# Patient Record
Sex: Male | Born: 1965 | Race: White | Hispanic: No | Marital: Married | Smoking: Current every day smoker
Health system: Southern US, Community
[De-identification: ages and names within clinical notes are randomized; demographics above are authoritative.]

## PROBLEM LIST (undated history)

## (undated) DIAGNOSIS — M5136 Other intervertebral disc degeneration, lumbar region: Secondary | ICD-10-CM

## (undated) DIAGNOSIS — E785 Hyperlipidemia, unspecified: Secondary | ICD-10-CM

## (undated) DIAGNOSIS — I10 Essential (primary) hypertension: Secondary | ICD-10-CM

## (undated) DIAGNOSIS — M51369 Other intervertebral disc degeneration, lumbar region without mention of lumbar back pain or lower extremity pain: Secondary | ICD-10-CM

## (undated) DIAGNOSIS — E669 Obesity, unspecified: Secondary | ICD-10-CM

## (undated) DIAGNOSIS — M5030 Other cervical disc degeneration, unspecified cervical region: Secondary | ICD-10-CM

## (undated) DIAGNOSIS — Z8249 Family history of ischemic heart disease and other diseases of the circulatory system: Secondary | ICD-10-CM

## (undated) HISTORY — DX: Hyperlipidemia, unspecified: E78.5

## (undated) HISTORY — DX: Essential (primary) hypertension: I10

## (undated) HISTORY — DX: Other intervertebral disc degeneration, lumbar region: M51.36

## (undated) HISTORY — DX: Obesity, unspecified: E66.9

## (undated) HISTORY — DX: Other cervical disc degeneration, unspecified cervical region: M50.30

## (undated) HISTORY — DX: Other intervertebral disc degeneration, lumbar region without mention of lumbar back pain or lower extremity pain: M51.369

## (undated) HISTORY — DX: Family history of ischemic heart disease and other diseases of the circulatory system: Z82.49

---

## 2012-06-19 ENCOUNTER — Other Ambulatory Visit: Payer: Self-pay | Admitting: Physician Assistant

## 2012-06-19 NOTE — Telephone Encounter (Signed)
Medication refilled per protocol. 

## 2012-10-04 ENCOUNTER — Other Ambulatory Visit: Payer: Managed Care, Other (non HMO)

## 2012-10-04 DIAGNOSIS — I10 Essential (primary) hypertension: Secondary | ICD-10-CM

## 2012-10-04 DIAGNOSIS — Z79899 Other long term (current) drug therapy: Secondary | ICD-10-CM

## 2012-10-04 DIAGNOSIS — E785 Hyperlipidemia, unspecified: Secondary | ICD-10-CM

## 2012-10-04 LAB — LIPID PANEL
HDL: 37 mg/dL — ABNORMAL LOW (ref >39)
LDL Cholesterol: 78 mg/dL (ref 0–99)
Total CHOL/HDL Ratio: 4.3 Ratio
VLDL: 45 mg/dL — ABNORMAL HIGH (ref 0–40)

## 2012-10-04 LAB — CBC WITH DIFFERENTIAL/PLATELET
Basophils Absolute: 0.1 10*3/uL (ref 0.0–0.1)
Eosinophils Relative: 4 % (ref 0–5)
Lymphocytes Relative: 31 % (ref 12–46)
MCV: 87.7 fL (ref 78.0–100.0)
Neutro Abs: 5.5 10*3/uL (ref 1.7–7.7)
Neutrophils Relative %: 56 % (ref 43–77)
Platelets: 322 10*3/uL (ref 150–400)
RBC: 4.79 MIL/uL (ref 4.22–5.81)
RDW: 12.6 % (ref 11.5–15.5)

## 2012-10-04 LAB — CMP AND LIVER
ALT: 16 U/L (ref 0–53)
AST: 15 U/L (ref 0–37)
Alkaline Phosphatase: 90 U/L (ref 39–117)
CO2: 29 mEq/L (ref 19–32)
Creat: 0.85 mg/dL (ref 0.50–1.35)
Sodium: 138 mEq/L (ref 135–145)
Total Bilirubin: 0.5 mg/dL (ref 0.3–1.2)
Total Protein: 6.5 g/dL (ref 6.0–8.3)

## 2012-10-09 ENCOUNTER — Encounter: Payer: Self-pay | Admitting: Physician Assistant

## 2012-10-09 ENCOUNTER — Ambulatory Visit (INDEPENDENT_AMBULATORY_CARE_PROVIDER_SITE_OTHER): Payer: Managed Care, Other (non HMO) | Admitting: Physician Assistant

## 2012-10-09 VITALS — BP 132/88 | HR 76 | Temp 98.3°F | Resp 18 | Ht 69.5 in | Wt 317.0 lb

## 2012-10-09 DIAGNOSIS — Z8249 Family history of ischemic heart disease and other diseases of the circulatory system: Secondary | ICD-10-CM

## 2012-10-09 DIAGNOSIS — F17200 Nicotine dependence, unspecified, uncomplicated: Secondary | ICD-10-CM

## 2012-10-09 DIAGNOSIS — I10 Essential (primary) hypertension: Secondary | ICD-10-CM | POA: Insufficient documentation

## 2012-10-09 DIAGNOSIS — E785 Hyperlipidemia, unspecified: Secondary | ICD-10-CM | POA: Insufficient documentation

## 2012-10-09 DIAGNOSIS — E669 Obesity, unspecified: Secondary | ICD-10-CM | POA: Insufficient documentation

## 2012-10-09 HISTORY — DX: Family history of ischemic heart disease and other diseases of the circulatory system: Z82.49

## 2012-10-09 MED ORDER — ATORVASTATIN CALCIUM 80 MG PO TABS: 80.0000 mg | ORAL_TABLET | Freq: Every day | ORAL | Status: DC

## 2012-10-09 MED ORDER — LISINOPRIL-HYDROCHLOROTHIAZIDE 20-25 MG PO TABS: ORAL_TABLET | ORAL | Status: DC

## 2012-10-09 NOTE — Progress Notes (Signed)
Patient ID: Jorge Bowman MRN: [id number], DOB: Nov 11, 1965, 47 y.o. Date of Encounter: @DATE @  Chief Complaint:  Chief Complaint  Patient presents with  . routine check up    med refills   had labs last week    HPI: 47 y.o. year old obese white male  presents for routine followup office visit.  Hypertension: He is taking medication as directed. No adverse effects. No lightheadedness. No leukemic edema. He checks his blood pressure at home and it's good readings.  Hyperlipidemia: He is taking Lipitor 80 mg as directed. He has no myalgias and no other adverse effects.  He reports that he knows that he and his weight is up today compared to the last office visit. 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 He is a very sedentary lifestyle with no exercise he says he has cut out sodas but otherwise has a very difficult time consuming a good diet  As well he continues to smoke and is smoking about one pack per day.  When he does any physical exertion he reports that he has no chest pressure tightness or heaviness he has no increased shortness of breath or dyspnea on exertion .   Past Medical History  Diagnosis Date  . Hyperlipidemia   . Hypertension   . Obesity   . Family history of premature coronary artery disease 10/09/2012     Home Meds: See attached medication section for current medication list. Any medications entered into computer today will not appear on this note's list. The medications listed below were entered prior to today. No current outpatient prescriptions on file prior to visit.   No current facility-administered medications on file prior to visit.    Allergies: No Known Allergies  History   Social History  . Marital Status: Married    Spouse Name: N/A    Number of Children: N/A  . Years of Education: N/A   Occupational History  . Not on file.   Social History Main Topics  . Smoking status: Current Every Day Smoker -- 1.00 packs/day   Types: Cigarettes  . Smokeless tobacco: Never Used  . Alcohol Use: No  . Drug Use: No  . Sexual Activity: Not on file   Other Topics Concern  . 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    Family History  Problem Relation Age of Onset  . Heart disease Father 50    stent     Review of Systems:  See HPI for pertinent ROS. All other ROS negative.    Physical Exam: Blood pressure 132/88, pulse 76, temperature 98.3 F (36.8 C), temperature source Oral, resp. rate 18, height 5' 9.5" (1.765 m), weight 317 lb (143.79 kg)., Body mass index is 46.16 kg/(m^2). General: An obese white male .Appears in no acute distress. Neck: Supple. No thyromegaly. No lymphadenopathy. No carotid bruit. Lungs: Clear bilaterally to auscultation without wheezes, rales, or rhonchi. Breathing is unlabored. Heart: RRR with S1 S2. No murmurs, rubs, or gallops. Abdomen: Soft, non-tender, non-distended with normoactive bowel sounds. No hepatomegaly. No rebound/guarding. No obvious abdominal masses. Musculoskeletal:  Strength and tone normal for age. Extremities/Skin: Warm and dry.  No edema.  Neuro: Alert and oriented X 3. Moves all extremities spontaneously. Gait is normal. CNII-XII grossly in tact. Psych:  Responds to questions appropriately with a normal affect.   Results for orders placed in  visit on 10/04/12  LIPID PANEL      Result Value Range   Cholesterol 160  0 - 200 mg/dL   Triglycerides 811 (*) <150 mg/dL   HDL 37 (*) >91 mg/dL   Total CHOL/HDL Ratio 4.3     VLDL 45 (*) 0 - 40 mg/dL   LDL Cholesterol 78  0 - 99 mg/dL  CMP AND LIVER      Result Value Range   Sodium 138  135 - 145 mEq/L   Potassium 4.6  3.5 - 5.3 mEq/L   Chloride 100  96 - 112 mEq/L   CO2 29  19 - 32 mEq/L   Glucose, Bld 99  70 - 99 mg/dL   BUN 14  6 - 23 mg/dL   Creat 4.78  2.95 - 6.21 mg/dL   Total Bilirubin 0.5  0.3 - 1.2 mg/dL   Alkaline Phosphatase 90   39 - 117 U/L   AST 15  0 - 37 U/L   ALT 16  0 - 53 U/L   Total Protein 6.5  6.0 - 8.3 g/dL   Albumin 4.2  3.5 - 5.2 g/dL   Calcium 9.1  8.4 - 30.8 mg/dL   Bilirubin, Direct 0.1  0.0 - 0.3 mg/dL   Indirect Bilirubin 0.4  0.0 - 0.9 mg/dL  CBC WITH DIFFERENTIAL      Result Value Range   WBC 9.8  4.0 - 10.5 K/uL   RBC 4.79  4.22 - 5.81 MIL/uL   Hemoglobin 14.7  13.0 - 17.0 g/dL   HCT 65.7  84.6 - 96.2 %   MCV 87.7  78.0 - 100.0 fL   MCH 30.7  26.0 - 34.0 pg   MCHC 35.0  30.0 - 36.0 g/dL   RDW 95.2  84.1 - 32.4 %   Platelets 322  150 - 400 K/uL   Neutrophils Relative % 56  43 - 77 %   Neutro Abs 5.5  1.7 - 7.7 K/uL   Lymphocytes Relative 31  12 - 46 %   Lymphs Abs 3.1  0.7 - 4.0 K/uL   Monocytes Relative 8  3 - 12 %   Monocytes Absolute 0.8  0.1 - 1.0 K/uL   Eosinophils Relative 4  0 - 5 %   Eosinophils Absolute 0.4  0.0 - 0.7 K/uL   Basophils Relative 1  0 - 1 %   Basophils Absolute 0.1  0.0 - 0.1 K/uL   Smear Review Criteria for review not met       ASSESSMENT AND PLAN:  47 y.o. year old male with  1. Family history of premature coronary artery disease Father with history of CAD. Father had stents and first diagnosed CAD in his 56s.  2. Hyperlipidemia At goal. Continue current medication. Recheck fasting labs and liver function test in 6 months. - atorvastatin (LIPITOR) 80 MG tablet; Take 1 tablet (80 mg total) by mouth at bedtime.  Dispense: 30 tablet; Refill: 5  3. Hypertension At goal. As well he says that he gets even better blood pressure readings at home. Antinuclear medication. Followup office visit and lab in 6 months. - lisinopril-hydrochlorothiazide (PRINZIDE,ZESTORETIC) 20-25 MG per tablet; TAKE ONE TABLET BY MOUTH EVERY DAY  Dispense: 30 tablet; Refill: 5  4. Obesity He really needs weight loss and compliance with diet and exercise especially in light of his family history. 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  5. Smoker unmotivated to quit He is not interested in using Chantix. I discussed possibly using Wellbutrin which could possibly help curb his cravings for her nicotine and smoking as well as curb his appetite for food. He says he will consider this but is not interested in pursuing this today. Discussed with him trying to at least decrease the amount of cigarettes he is smoking per day even if he cannot quit.  Have discussed with him that especially given his father's family history he really needs to do everything he can to decrease his risk for developing cardiac disease. He really needs to improve his diet and exercise and weight loss and off it he really needs to stop smoking to help reduce this risk. Fasting labs a regular office visit in 6 months or sooner if he needs [place].   [address] [place], [place], [facility] 10/09/2012 5:41 PM

## 2013-05-21 ENCOUNTER — Other Ambulatory Visit: Payer: Managed Care, Other (non HMO)

## 2013-05-21 DIAGNOSIS — Z79899 Other long term (current) drug therapy: Secondary | ICD-10-CM

## 2013-05-21 DIAGNOSIS — E785 Hyperlipidemia, unspecified: Secondary | ICD-10-CM

## 2013-05-21 LAB — LIPID PANEL
CHOL/HDL RATIO: 3.9 ratio
CHOLESTEROL: 139 mg/dL (ref 0–200)
HDL: 36 mg/dL — ABNORMAL LOW (ref >39)
LDL CALC: 74 mg/dL (ref 0–99)
TRIGLYCERIDES: 146 mg/dL (ref <150)
VLDL: 29 mg/dL (ref 0–40)

## 2013-05-21 LAB — CBC WITH DIFFERENTIAL/PLATELET
BASOS PCT: 1 % (ref 0–1)
Basophils Absolute: 0.1 10*3/uL (ref 0.0–0.1)
EOS ABS: 0.5 10*3/uL (ref 0.0–0.7)
Eosinophils Relative: 5 % (ref 0–5)
HCT: 42.6 % (ref 39.0–52.0)
HEMOGLOBIN: 14.6 g/dL (ref 13.0–17.0)
Lymphocytes Relative: 37 % (ref 12–46)
Lymphs Abs: 4 10*3/uL (ref 0.7–4.0)
MCH: 30.1 pg (ref 26.0–34.0)
MCHC: 34.3 g/dL (ref 30.0–36.0)
MCV: 87.8 fL (ref 78.0–100.0)
MONOS PCT: 9 % (ref 3–12)
Monocytes Absolute: 1 10*3/uL (ref 0.1–1.0)
NEUTROS ABS: 5.1 10*3/uL (ref 1.7–7.7)
NEUTROS PCT: 48 % (ref 43–77)
PLATELETS: 328 10*3/uL (ref 150–400)
RBC: 4.85 MIL/uL (ref 4.22–5.81)
RDW: 12.8 % (ref 11.5–15.5)
WBC: 10.7 10*3/uL — ABNORMAL HIGH (ref 4.0–10.5)

## 2013-05-21 LAB — COMPLETE METABOLIC PANEL WITH GFR
ALBUMIN: 4 g/dL (ref 3.5–5.2)
ALK PHOS: 90 U/L (ref 39–117)
ALT: 14 U/L (ref 0–53)
AST: 14 U/L (ref 0–37)
BILIRUBIN TOTAL: 0.4 mg/dL (ref 0.2–1.2)
BUN: 16 mg/dL (ref 6–23)
CO2: 32 mEq/L (ref 19–32)
Calcium: 9.2 mg/dL (ref 8.4–10.5)
Chloride: 102 mEq/L (ref 96–112)
Creat: 0.92 mg/dL (ref 0.50–1.35)
GFR, Est African American: >89 mL/min
GFR, Est Non African American: >89 mL/min
Glucose, Bld: 92 mg/dL (ref 70–99)
POTASSIUM: 4.7 meq/L (ref 3.5–5.3)
SODIUM: 140 meq/L (ref 135–145)
TOTAL PROTEIN: 6.2 g/dL (ref 6.0–8.3)

## 2013-06-04 ENCOUNTER — Ambulatory Visit (INDEPENDENT_AMBULATORY_CARE_PROVIDER_SITE_OTHER): Payer: Managed Care, Other (non HMO) | Admitting: Physician Assistant

## 2013-06-04 ENCOUNTER — Encounter: Payer: Self-pay | Admitting: Physician Assistant

## 2013-06-04 VITALS — BP 120/60 | HR 68 | Temp 98.7°F | Resp 18 | Ht 70.0 in | Wt 303.0 lb

## 2013-06-04 DIAGNOSIS — Z8249 Family history of ischemic heart disease and other diseases of the circulatory system: Secondary | ICD-10-CM

## 2013-06-04 DIAGNOSIS — E785 Hyperlipidemia, unspecified: Secondary | ICD-10-CM

## 2013-06-04 DIAGNOSIS — E669 Obesity, unspecified: Secondary | ICD-10-CM

## 2013-06-04 DIAGNOSIS — I10 Essential (primary) hypertension: Secondary | ICD-10-CM

## 2013-06-04 MED ORDER — ATORVASTATIN CALCIUM 80 MG PO TABS: 80.0000 mg | ORAL_TABLET | Freq: Every day | ORAL | Status: DC

## 2013-06-04 MED ORDER — LISINOPRIL-HYDROCHLOROTHIAZIDE 20-25 MG PO TABS: ORAL_TABLET | ORAL | Status: DC

## 2013-06-04 NOTE — Progress Notes (Signed)
Patient ID: Kamren Heintzelman MRN: [id number], DOB: 1965-10-08, 48 y.o. Date of Encounter: @DATE @  Chief Complaint:  Chief Complaint  Patient presents with  . medication check    declined Tdap and PNA shot    HPI: 48 y.o. year old obese white male  presents for routine followup office visit.  Hypertension: He is taking medication as directed. No adverse effects. No lightheadedness. No lower extremity edema. He checks his blood pressure at home and gets good readings.  Hyperlipidemia: He is taking Lipitor 80 mg as directed. He has no myalgias and no other adverse effects.  At his LOV, which was 10/09/12 --He reported that he knew that  his weight was up  compared to the prior office visit. 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 He reported a very sedentary lifestyle with no exercise. Also,  he said he had cut out sodas but otherwise had a very difficult time consuming a good diet. TODAY: He says that he has been exercising every day for at least 30 minutes sometimes up to 90 minutes. Says he does  " Power Walking".  Says that he sometimes walks outside. Says he also has a treadmill at home that he can use if needed. Says that when he is traveling and is in a hotel-- he turns the heat to 80 and walks back and forth across the hotel room!!  Says he likes to keep up with the news so  he turns on the news on TV and  walks back and forth while he listens to that--says it keep shim entertained!!  Says he is smoking about the same amount as at the last visit. Says this is about  1 - 1 1/2 ppd.  When he does any physical exertion he reports that he has no chest pressure tightness or heaviness he has no increased shortness of breath or dyspnea on exertion .   Past Medical History  Diagnosis Date  . Hyperlipidemia   . Hypertension   . Obesity   . Family history of premature coronary artery disease 10/09/2012     Home Meds: See attached medication section for current medication  list. Any medications entered into computer today will not appear on this note's list. The medications listed below were entered prior to today. He is on Lipitor 80 mg daily. He is on lisinopril HCTZ 20/25 mg.  Current Outpatient Prescriptions on File Prior to Visit  Medication Sig Dispense Refill  . Multiple Vitamin (MULTIVITAMIN) tablet Take 1 tablet by mouth daily.       No current facility-administered medications on file prior to visit.    Allergies: No Known Allergies  History   Social History  . Marital Status: Married    Spouse Name: N/A    Number of Children: N/A  . Years of Education: N/A   Occupational History  . Not on file.   Social History Main Topics  . Smoking status: Current Every Day Smoker -- 1.00 packs/day    Types: Cigarettes  . Smokeless tobacco: Never Used  . Alcohol Use: No  . Drug Use: No  . Sexual Activity: Not on file   Other Topics Concern  . 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    Family History  Problem Relation Age of Onset  . Heart disease Father 13    stent  Review of Systems:  See HPI for pertinent ROS. All other ROS negative.    Physical Exam: Blood pressure 120/60, pulse 68, temperature 98.7 F (37.1 C), temperature source Oral, resp. rate 18, height 5' 10"  (1.778 m), weight 303 lb (137.44 kg)., Body mass index is 43.48 kg/(m^2). General: An obese white male .Appears in no acute distress. Neck: Supple. No thyromegaly. No lymphadenopathy. No carotid bruit. Lungs: Clear bilaterally to auscultation without wheezes, rales, or rhonchi. Breathing is unlabored. Heart: RRR with S1 S2. No murmurs, rubs, or gallops. Abdomen: Soft, non-tender, non-distended with normoactive bowel sounds. No hepatomegaly. No rebound/guarding. No obvious abdominal masses. Musculoskeletal:  Strength and tone normal for age. Extremities/Skin: Warm and dry.  No edema.  Neuro: Alert  and oriented X 3. Moves all extremities spontaneously. Gait is normal. CNII-XII grossly in tact. Psych:  Responds to questions appropriately with a normal affect.   Results for orders placed in visit on 05/21/13  LIPID PANEL      Result Value Ref Range   Cholesterol 139  0 - 200 mg/dL   Triglycerides 146  <150 mg/dL   HDL 36 (*) >39 mg/dL   Total CHOL/HDL Ratio 3.9     VLDL 29  0 - 40 mg/dL   LDL Cholesterol 74  0 - 99 mg/dL  CBC WITH DIFFERENTIAL      Result Value Ref Range   WBC 10.7 (*) 4.0 - 10.5 K/uL   RBC 4.85  4.22 - 5.81 MIL/uL   Hemoglobin 14.6  13.0 - 17.0 g/dL   HCT 42.6  39.0 - 52.0 %   MCV 87.8  78.0 - 100.0 fL   MCH 30.1  26.0 - 34.0 pg   MCHC 34.3  30.0 - 36.0 g/dL   RDW 12.8  11.5 - 15.5 %   Platelets 328  150 - 400 K/uL   Neutrophils Relative % 48  43 - 77 %   Neutro Abs 5.1  1.7 - 7.7 K/uL   Lymphocytes Relative 37  12 - 46 %   Lymphs Abs 4.0  0.7 - 4.0 K/uL   Monocytes Relative 9  3 - 12 %   Monocytes Absolute 1.0  0.1 - 1.0 K/uL   Eosinophils Relative 5  0 - 5 %   Eosinophils Absolute 0.5  0.0 - 0.7 K/uL   Basophils Relative 1  0 - 1 %   Basophils Absolute 0.1  0.0 - 0.1 K/uL   Smear Review Criteria for review not met    COMPLETE METABOLIC PANEL WITH GFR      Result Value Ref Range   Sodium 140  135 - 145 mEq/L   Potassium 4.7  3.5 - 5.3 mEq/L   Chloride 102  96 - 112 mEq/L   CO2 32  19 - 32 mEq/L   Glucose, Bld 92  70 - 99 mg/dL   BUN 16  6 - 23 mg/dL   Creat 0.92  0.50 - 1.35 mg/dL   Total Bilirubin 0.4  0.2 - 1.2 mg/dL   Alkaline Phosphatase 90  39 - 117 U/L   AST 14  0 - 37 U/L   ALT 14  0 - 53 U/L   Total Protein 6.2  6.0 - 8.3 g/dL   Albumin 4.0  3.5 - 5.2 g/dL   Calcium 9.2  8.4 - 10.5 mg/dL   GFR, Est African American >89     GFR, Est Non African American >89       ASSESSMENT AND  PLAN:  48 y.o. year old male with  1. Family history of premature coronary artery disease Father with history of CAD. Father had stents and first  diagnosed CAD in his 22s.  2. Hyperlipidemia At goal. Continue current medication. Recheck fasting labs and liver function test in 6 months. IF HE CONTINUES WEIGHT LOSS, CAN PROBABLY DECREASE LIPITOR DOSE IN FUTURE - atorvastatin (LIPITOR) 80 MG tablet; Take 1 tablet (80 mg total) by mouth at bedtime.  Dispense: 30 tablet; Refill: 5  3. Hypertension At goal. Continue current medication. Followup office visit and lab in 6 months. - lisinopril-hydrochlorothiazide (PRINZIDE,ZESTORETIC) 20-25 MG per tablet; TAKE ONE TABLET BY MOUTH EVERY DAY  Dispense: 30 tablet; Refill: 5  4. Obesity CONGRATS!!--Good Job with the weight loss and exercise!! At office visit 10/2012 weight was 317.  Today is 303. !!  5. Smoker unmotivated to quit He is not interested in using Chantix. I discussed possibly using Wellbutrin which could possibly help curb his cravings for her nicotine and smoking as well as curb his appetite for food. He says he will consider this but is not interested in pursuing this today. Discussed with him trying to at least decrease the amount of cigarettes he is smoking per day even if he cannot quit. We Had the same conversation at last office visit and had the same conversation again today. He has not decreased his smoking at all.  Have discussed with him that especially given his father's family history he really needs to do everything he can to decrease his risk for developing cardiac disease. He really needs to improve his diet and exercise and weight loss and off it he really needs to stop smoking to help reduce this risk. Good job on the exercise and weight loss!!  ToDay discussed that he needs to have a pneumonia vaccine given his smoking. He defers. Discussed that he would only need this 1 pneumonia vaccine and he would not need another one until age 74. He says that he will research this and think about it and let me know at  the next visit.  Also discussed tetanus vaccine. Says he has  not had one in greater than 10 years. He defers this today.  Fasting labs and regular office visit in 6 months or sooner if he needs [place]. He states that he prefers to have his labs done while he is here at the visit instead of coming here for 2 separate trips. To schedule his next appointment early morning and come fasting to the appointment so we can do labs while he is here at the visit.   Signed, [address] [place], [place], [facility] 06/04/2013 9:50 AM

## 2013-06-18 ENCOUNTER — Encounter: Payer: Self-pay | Admitting: Physician Assistant

## 2013-06-18 ENCOUNTER — Ambulatory Visit (INDEPENDENT_AMBULATORY_CARE_PROVIDER_SITE_OTHER): Payer: Managed Care, Other (non HMO) | Admitting: Physician Assistant

## 2013-06-18 VITALS — BP 122/86 | HR 76 | Temp 98.3°F | Resp 18 | Wt 300.0 lb

## 2013-06-18 DIAGNOSIS — R1900 Intra-abdominal and pelvic swelling, mass and lump, unspecified site: Secondary | ICD-10-CM

## 2013-06-18 NOTE — Progress Notes (Signed)
Patient ID: Jorge Bowman MRN: [id number], DOB: 1965-11-10, 48 y.o. Date of Encounter: 06/18/2013, 3:50 PM    Chief Complaint:  Chief Complaint  Patient presents with  . c/o poss Hiatal Hernia    knot pops up under rib cage when active     HPI: 48 y.o. year old white male reports that for about 2 years now he has occasionally noticed a sharp pain in his left upper quadrant, just below the left rib cage. 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    Home Meds: See attached medication section for any medications that were entered at today's visit  The computer does not put those onto this list.The following list is a list of meds entered prior to today's visit.   Current Outpatient Prescriptions on File Prior to Visit  Medication Sig Dispense Refill  . atorvastatin (LIPITOR) 80 MG tablet Take 1 tablet (80 mg total) by mouth at bedtime.  30 tablet  5  . lisinopril-hydrochlorothiazide (PRINZIDE,ZESTORETIC) 20-25 MG per tablet TAKE ONE TABLET BY MOUTH EVERY DAY  30 tablet  5  . Multiple Vitamin (MULTIVITAMIN) tablet Take 1 tablet by mouth daily.       No current facility-administered medications on file prior to visit.      Allergies: No Known Allergies    Review of Systems: See HPI for pertinent ROS. All other ROS negative.    Physical Exam: Blood pressure 122/86, pulse 76, temperature 98.3 F (36.8 C), temperature source Oral, resp. rate 18, weight 300 lb (136.079 kg)., Body mass index is 43.05 kg/(m^2). General:  Obese WM. Appears in no acute  distress. Neck: Supple. No thyromegaly. No lymphadenopathy. Lungs: Clear bilaterally to auscultation without wheezes, rales, or rhonchi. Breathing is unlabored. Heart: Regular rhythm. No murmurs, rubs, or gallops. Abdomen: Soft, non-tender, non-distended with normoactive bowel sounds. No hepatomegaly. No rebound/guarding.  With inspection, there is an area on the left upper quadrant, just below the left rib cage, that looks like it protrudes slightly more than the surrounding area and more than the corresponding area on the right side of the body. However, this is VERY minimal/slight. With palpation, I can feel no definite edge to this "mass"/area. There is no palpable area of firmness.  I also had him slowly roll down from sitting to lying--inspecting and palpating area with this movement but no change in exam with this. Also inspected area as he slowly went from lying to sitting--again-no change in exam findings with this either.  Msk:  Strength and tone normal for age. Extremities/Skin: Warm and dry. Neuro: Alert and oriented X 3. Moves all extremities spontaneously. Gait is normal. CNII-XII grossly in tact. Psych:  Responds to questions appropriately with a normal affect.     ASSESSMENT AND PLAN:  48 y.o. year old male with  1. Mass of abdomen He recently had labs including a CBC and CMET which were normal. At this time will obtain a CT to further evaluate. Will Followup with  patient when I receive CT results. - CT Abdomen Pelvis W Contrast; Future   Signed, [address] [place], [place], [facility] 06/18/2013 3:50 PM

## 2013-06-19 ENCOUNTER — Ambulatory Visit (HOSPITAL_COMMUNITY): Payer: Managed Care, Other (non HMO)

## 2013-06-22 ENCOUNTER — Ambulatory Visit (HOSPITAL_COMMUNITY)
Admission: RE | Admit: 2013-06-22 | Discharge: 2013-06-22 | Disposition: A | Discharge status: Home / Self Care | Payer: Managed Care, Other (non HMO) | Source: Ambulatory Visit | Attending: Physician Assistant | Admitting: Physician Assistant

## 2013-06-22 ENCOUNTER — Telehealth: Payer: Self-pay | Admitting: Family Medicine

## 2013-06-22 DIAGNOSIS — E279 Disorder of adrenal gland, unspecified: Principal | ICD-10-CM

## 2013-06-22 DIAGNOSIS — R1012 Left upper quadrant pain: Secondary | ICD-10-CM | POA: Insufficient documentation

## 2013-06-22 DIAGNOSIS — R1900 Intra-abdominal and pelvic swelling, mass and lump, unspecified site: Secondary | ICD-10-CM

## 2013-06-22 DIAGNOSIS — R1902 Left upper quadrant abdominal swelling, mass and lump: Secondary | ICD-10-CM | POA: Insufficient documentation

## 2013-06-22 DIAGNOSIS — E278 Other specified disorders of adrenal gland: Secondary | ICD-10-CM

## 2013-06-22 MED ORDER — IOHEXOL 300 MG/ML  SOLN
100.0000 mL | Freq: Once | INTRAMUSCULAR | Status: AC | PRN
  Administered 2013-06-22: 100 mL via INTRAVENOUS

## 2013-06-22 NOTE — Telephone Encounter (Signed)
Message copied by Olena Mater on Fri Jun 22, 2013  4:24 PM ------      Message from: Dena Billet      Created: Fri Jun 22, 2013  3:30 PM       Tell pt that the CT shows nothing to explain the area of concern (he had noticed area just under left rib cage as appearing to protrude more than that same area on the right) and also was having episodes of pain in that area).      However, tell him CT findings of "indeterminate left adrenal nodule" and "indeterminate lesion left kidney".      Tell him the recommendations of: 1--Pre and Post Contrast MRI--to f/u lesion in left kidney.                                                              2- F/U CT in 12 months to f/u Adrenal Nodule.       Please Place these orders. ------

## 2013-06-22 NOTE — Telephone Encounter (Signed)
Pt aware of CT result and further recommendations.  MRI ordered.

## 2013-07-06 ENCOUNTER — Ambulatory Visit (HOSPITAL_COMMUNITY)
Admission: RE | Admit: 2013-07-06 | Discharge: 2013-07-06 | Disposition: A | Discharge status: Home / Self Care | Payer: Managed Care, Other (non HMO) | Source: Ambulatory Visit | Attending: Physician Assistant | Admitting: Physician Assistant

## 2013-07-06 DIAGNOSIS — E279 Disorder of adrenal gland, unspecified: Principal | ICD-10-CM

## 2013-07-06 DIAGNOSIS — E278 Other specified disorders of adrenal gland: Secondary | ICD-10-CM

## 2013-07-08 NOTE — Progress Notes (Signed)
This man recently had an office visit with me. He then had an abdominal CT. The Radiologist who read the CT recommended followup MRI. It appears that this MRI was supposed to have been done on Friday 07/06/13. However, it appears that the patient did not show for this procedure. Please followup with the patient to figure out why he did not go. Find out whether he simply forgot or whether he needs it rescheduled to a different time or whether he does not plan to go for this followup and then let me know.

## 2013-07-16 ENCOUNTER — Encounter (HOSPITAL_COMMUNITY): Payer: Self-pay

## 2013-07-16 ENCOUNTER — Ambulatory Visit (HOSPITAL_COMMUNITY)
Admission: RE | Admit: 2013-07-16 | Discharge: 2013-07-16 | Disposition: A | Discharge status: Home / Self Care | Payer: Managed Care, Other (non HMO) | Source: Ambulatory Visit | Attending: Physician Assistant | Admitting: Physician Assistant

## 2013-07-16 ENCOUNTER — Other Ambulatory Visit: Payer: Self-pay | Admitting: Physician Assistant

## 2013-07-16 DIAGNOSIS — E278 Other specified disorders of adrenal gland: Secondary | ICD-10-CM

## 2013-07-16 DIAGNOSIS — E279 Disorder of adrenal gland, unspecified: Secondary | ICD-10-CM

## 2013-07-16 DIAGNOSIS — D3500 Benign neoplasm of unspecified adrenal gland: Secondary | ICD-10-CM | POA: Insufficient documentation

## 2013-07-16 LAB — POCT I-STAT CREATININE: Creatinine, Ser: 1 mg/dL (ref 0.50–1.35)

## 2013-07-16 MED ORDER — SODIUM CHLORIDE 0.9 % IV SOLN
INTRAVENOUS | Status: AC
  Filled 2013-07-16: qty 200

## 2013-07-16 MED ORDER — GADOBENATE DIMEGLUMINE 529 MG/ML IV SOLN: 20.0000 mL | Freq: Once | INTRAVENOUS | Status: AC | PRN

## 2013-12-31 ENCOUNTER — Other Ambulatory Visit: Payer: Managed Care, Other (non HMO)

## 2013-12-31 DIAGNOSIS — Z8249 Family history of ischemic heart disease and other diseases of the circulatory system: Secondary | ICD-10-CM

## 2013-12-31 DIAGNOSIS — I10 Essential (primary) hypertension: Secondary | ICD-10-CM

## 2013-12-31 DIAGNOSIS — E785 Hyperlipidemia, unspecified: Secondary | ICD-10-CM

## 2013-12-31 LAB — CBC WITH DIFFERENTIAL/PLATELET
Basophils Absolute: 0.1 10*3/uL (ref 0.0–0.1)
Basophils Relative: 1 % (ref 0–1)
Eosinophils Absolute: 0.5 10*3/uL (ref 0.0–0.7)
Eosinophils Relative: 5 % (ref 0–5)
HEMATOCRIT: 42.3 % (ref 39.0–52.0)
Hemoglobin: 15 g/dL (ref 13.0–17.0)
LYMPHS PCT: 33 % (ref 12–46)
Lymphs Abs: 3.1 10*3/uL (ref 0.7–4.0)
MCH: 30.7 pg (ref 26.0–34.0)
MCHC: 35.5 g/dL (ref 30.0–36.0)
MCV: 86.5 fL (ref 78.0–100.0)
MONO ABS: 0.9 10*3/uL (ref 0.1–1.0)
MPV: 10.1 fL (ref 9.4–12.4)
Monocytes Relative: 10 % (ref 3–12)
NEUTROS ABS: 4.8 10*3/uL (ref 1.7–7.7)
Neutrophils Relative %: 51 % (ref 43–77)
Platelets: 339 10*3/uL (ref 150–400)
RBC: 4.89 MIL/uL (ref 4.22–5.81)
RDW: 12.8 % (ref 11.5–15.5)
WBC: 9.4 10*3/uL (ref 4.0–10.5)

## 2013-12-31 LAB — COMPLETE METABOLIC PANEL WITH GFR
ALBUMIN: 4.1 g/dL (ref 3.5–5.2)
ALK PHOS: 109 U/L (ref 39–117)
ALT: 19 U/L (ref 0–53)
AST: 16 U/L (ref 0–37)
BUN: 14 mg/dL (ref 6–23)
CO2: 29 mEq/L (ref 19–32)
Calcium: 9 mg/dL (ref 8.4–10.5)
Chloride: 100 mEq/L (ref 96–112)
Creat: 0.83 mg/dL (ref 0.50–1.35)
GFR, Est African American: >89 mL/min
GFR, Est Non African American: >89 mL/min
GLUCOSE: 95 mg/dL (ref 70–99)
POTASSIUM: 5 meq/L (ref 3.5–5.3)
Sodium: 138 mEq/L (ref 135–145)
TOTAL PROTEIN: 6.6 g/dL (ref 6.0–8.3)
Total Bilirubin: 0.4 mg/dL (ref 0.2–1.2)

## 2013-12-31 LAB — LIPID PANEL
Cholesterol: 159 mg/dL (ref 0–200)
HDL: 33 mg/dL — AB (ref >39)
LDL CALC: 94 mg/dL (ref 0–99)
Total CHOL/HDL Ratio: 4.8 Ratio
Triglycerides: 159 mg/dL — ABNORMAL HIGH (ref <150)
VLDL: 32 mg/dL (ref 0–40)

## 2014-01-07 ENCOUNTER — Ambulatory Visit (INDEPENDENT_AMBULATORY_CARE_PROVIDER_SITE_OTHER): Payer: Managed Care, Other (non HMO) | Admitting: Physician Assistant

## 2014-01-07 ENCOUNTER — Encounter: Payer: Self-pay | Admitting: Physician Assistant

## 2014-01-07 VITALS — BP 114/68 | HR 76 | Temp 98.5°F | Resp 18 | Wt 308.0 lb

## 2014-01-07 DIAGNOSIS — E669 Obesity, unspecified: Secondary | ICD-10-CM

## 2014-01-07 DIAGNOSIS — Z8249 Family history of ischemic heart disease and other diseases of the circulatory system: Secondary | ICD-10-CM

## 2014-01-07 DIAGNOSIS — H6501 Acute serous otitis media, right ear: Secondary | ICD-10-CM

## 2014-01-07 DIAGNOSIS — E785 Hyperlipidemia, unspecified: Secondary | ICD-10-CM

## 2014-01-07 DIAGNOSIS — I10 Essential (primary) hypertension: Secondary | ICD-10-CM

## 2014-01-07 MED ORDER — AMOXICILLIN-POT CLAVULANATE 875-125 MG PO TABS: 1.0000 | ORAL_TABLET | Freq: Two times a day (BID) | ORAL | Status: DC

## 2014-01-07 MED ORDER — LISINOPRIL-HYDROCHLOROTHIAZIDE 20-25 MG PO TABS: ORAL_TABLET | ORAL | Status: DC

## 2014-01-07 MED ORDER — ATORVASTATIN CALCIUM 80 MG PO TABS: 80.0000 mg | ORAL_TABLET | Freq: Every day | ORAL | Status: DC

## 2014-01-07 NOTE — Progress Notes (Signed)
Patient ID: Jorge Bowman MRN: [id number], DOB: 05-09-1965, 48 y.o. Date of Encounter: @DATE @  Chief Complaint:  Chief Complaint  Patient presents with  . routine check up    labs last week  . Medication Refill    HPI: 48 y.o. year old obese white male  presents for routine followup office visit.  Hypertension: He is taking medication as directed. No adverse effects. No lightheadedness. No lower extremity edema. He checks his blood pressure at home and gets good readings.  Hyperlipidemia: He is taking Lipitor 80 mg as directed. He has no myalgias and no other adverse effects.  At his OV  10/09/12 --He reported that he knew that  his weight was up  compared to the prior office visit. 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 He reported a very sedentary lifestyle with no exercise. Also,  he said he had cut out sodas but otherwise had a very difficult time consuming a good diet.  At [place] 06/2013: He says that he has been exercising every day for at least 30 minutes sometimes up to 90 minutes. Says he does  " Power Walking".  Says that he sometimes walks outside. Says he also has a treadmill at home that he can use if needed. Says that when he is traveling and is in a hotel-- he turns the heat to 80 and walks back and forth across the hotel room!!  Says he likes to keep up with the news so  he turns on the news on TV and  walks back and forth while he listens to that--says it keep shim entertained!!   At [place] 01/2014--he says that he is no longer traveling--is home at nights now---but is using his treadmill about 3 days / week.    At [place] 01/2014--he says that recently in mornings has felt pressure in ears. Several days ago felt sore lymph node right neck.  Blowing no mucus from nose. No increased cough (smoker)  Says he is smoking about the same amount as at the last visit. Says this is about  1 - 1 1/2 ppd.  When he does any physical exertion he reports that he has no chest  pressure tightness or heaviness he has no increased shortness of breath or dyspnea on exertion .   Past Medical History  Diagnosis Date  . Hyperlipidemia   . Hypertension   . Obesity   . Family history of premature coronary artery disease 10/09/2012     Home Meds: Outpatient Prescriptions Prior to Visit  Medication Sig Dispense Refill  . atorvastatin (LIPITOR) 80 MG tablet Take 1 tablet (80 mg total) by mouth at bedtime. 30 tablet 5  . lisinopril-hydrochlorothiazide (PRINZIDE,ZESTORETIC) 20-25 MG per tablet TAKE ONE TABLET BY MOUTH EVERY DAY 30 tablet 5  . Multiple Vitamin (MULTIVITAMIN) tablet Take 1 tablet by mouth daily.     No facility-administered medications prior to visit.     Current Outpatient Prescriptions on File Prior to Visit  Medication Sig Dispense Refill  . atorvastatin (LIPITOR) 80 MG tablet Take 1 tablet (80 mg total) by mouth at bedtime. 30 tablet 5  . lisinopril-hydrochlorothiazide (PRINZIDE,ZESTORETIC) 20-25 MG per tablet TAKE ONE TABLET BY MOUTH EVERY DAY 30 tablet 5  . Multiple Vitamin (MULTIVITAMIN) tablet Take 1 tablet by mouth daily.     No current facility-administered medications on file prior to visit.    Allergies: No Known Allergies  History   Social History  . Marital Status:  Legally Separated    Spouse Name: N/A    Number of Children: N/A  . Years of Education: N/A   Occupational History  . Not on file.   Social History Main Topics  . Smoking status: Current Every Day Smoker -- 1.00 packs/day    Types: Cigarettes  . Smokeless tobacco: Never Used  . Alcohol Use: No  . Drug Use: No  . Sexual Activity: Not on file   Other Topics Concern  . 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    Family History  Problem Relation Age of Onset  . Heart disease Father 1    stent     Review of Systems:  See HPI for pertinent ROS. All other ROS negative.    Physical  Exam: Blood pressure 114/68, pulse 76, temperature 98.5 F (36.9 C), temperature source Oral, resp. rate 18, weight 308 lb (139.708 kg)., Body mass index is 44.19 kg/(m^2). General: An obese white male .Appears in no acute distress. Left TM: slightly dull. O/w nml Right TM; There is area of bright red erythema around periphery.      There is area of golden color. Dull  Throat--naormal.  Sinuses: No tenderness wht percussion.  Neck: Supple. No thyromegaly. No lymphadenopathy. No carotid bruit. Lungs: Clear bilaterally to auscultation without wheezes, rales, or rhonchi. Breathing is unlabored. Heart: RRR with S1 S2. No murmurs, rubs, or gallops. Abdomen: Soft, non-tender, non-distended with normoactive bowel sounds. No hepatomegaly. No rebound/guarding. No obvious abdominal masses. Musculoskeletal:  Strength and tone normal for age. Extremities/Skin: Warm and dry.  No edema.  Neuro: Alert and oriented X 3. Moves all extremities spontaneously. Gait is normal. CNII-XII grossly in tact. Psych:  Responds to questions appropriately with a normal affect.   Results for orders placed or performed in visit on 12/31/13  Lipid Panel  Result Value Ref Range   Cholesterol 159 0 - 200 mg/dL   Triglycerides 159 (H) <150 mg/dL   HDL 33 (L) >39 mg/dL   Total CHOL/HDL Ratio 4.8 Ratio   VLDL 32 0 - 40 mg/dL   LDL Cholesterol 94 0 - 99 mg/dL  CBC with Differential  Result Value Ref Range   WBC 9.4 4.0 - 10.5 K/uL   RBC 4.89 4.22 - 5.81 MIL/uL   Hemoglobin 15.0 13.0 - 17.0 g/dL   HCT 42.3 39.0 - 52.0 %   MCV 86.5 78.0 - 100.0 fL   MCH 30.7 26.0 - 34.0 pg   MCHC 35.5 30.0 - 36.0 g/dL   RDW 12.8 11.5 - 15.5 %   Platelets 339 150 - 400 K/uL   Neutrophils Relative % 51 43 - 77 %   Neutro Abs 4.8 1.7 - 7.7 K/uL   Lymphocytes Relative 33 12 - 46 %   Lymphs Abs 3.1 0.7 - 4.0 K/uL   Monocytes Relative 10 3 - 12 %   Monocytes Absolute 0.9 0.1 - 1.0 K/uL   Eosinophils Relative 5 0 - 5 %   Eosinophils  Absolute 0.5 0.0 - 0.7 K/uL   Basophils Relative 1 0 - 1 %   Basophils Absolute 0.1 0.0 - 0.1 K/uL   Smear Review Criteria for review not met    MPV 10.1 9.4 - 12.4 fL  COMPLETE METABOLIC PANEL WITH GFR  Result Value Ref Range   Sodium 138 135 - 145 mEq/L   Potassium 5.0 3.5 - 5.3 mEq/L  Chloride 100 96 - 112 mEq/L   CO2 29 19 - 32 mEq/L   Glucose, Bld 95 70 - 99 mg/dL   BUN 14 6 - 23 mg/dL   Creat 0.83 0.50 - 1.35 mg/dL   Total Bilirubin 0.4 0.2 - 1.2 mg/dL   Alkaline Phosphatase 109 39 - 117 U/L   AST 16 0 - 37 U/L   ALT 19 0 - 53 U/L   Total Protein 6.6 6.0 - 8.3 g/dL   Albumin 4.1 3.5 - 5.2 g/dL   Calcium 9.0 8.4 - 10.5 mg/dL   GFR, Est African American >89 mL/min   GFR, Est Non African American >89 mL/min     ASSESSMENT AND PLAN:  48 y.o. year old male with  1. Family history of premature coronary artery disease Father with history of CAD. Father had stents and first diagnosed CAD in his 78s.  2. Hyperlipidemia At goal. Continue current medication. Recheck fasting labs and liver function test in 6 months. - atorvastatin (LIPITOR) 80 MG tablet; Take 1 tablet (80 mg total) by mouth at bedtime.  Dispense: 30 tablet; Refill: 5  3. Hypertension At goal. Continue current medication. Followup office visit and lab in 6 months. - lisinopril-hydrochlorothiazide (PRINZIDE,ZESTORETIC) 20-25 MG per tablet; TAKE ONE TABLET BY MOUTH EVERY DAY  Dispense: 30 tablet; Refill: 5  4. Obesity CONGRATS!!--Good Job with the weight loss and exercise!! At office visit 10/2012 weight was 317.  06/2013 is 303. !! 01/2014--305---says had vacation in October, then Thanksgiving--try to keep this controlled!!!  5. Smoker unmotivated to quit He is not interested in using Chantix. I discussed possibly using Wellbutrin which could possibly help curb his cravings for her nicotine and smoking as well as curb his appetite for food. He says he will consider this but is not interested in pursuing this today.  Discussed with him trying to at least decrease the amount of cigarettes he is smoking per day even if he cannot quit. We Had the same conversation at last office visit and had the same conversation again today. He has not decreased his smoking at all.  Have discussed with him that especially given his father's family history he really needs to do everything he can to decrease his risk for developing cardiac disease. He really needs to improve his diet and exercise and weight loss and off it he really needs to stop smoking to help reduce this risk. Good job on the exercise and weight loss!!   discussed that he needs to have a pneumonia vaccine given his smoking. He defers. Discussed that he would only need this 1 pneumonia vaccine and he would not need another one until age 13. He says that he will research this and think about it and let me know at  the next visit.  Also discussed tetanus vaccine. Says he has not had one in greater than 10 years. He defers this today.  Discussed vaccines at length 06/2013 and again 01/2014---still defers.   Fasting labs and regular office visit in 6 months or sooner if he needs [place]. He states that he prefers to have his labs done while he is here at the visit instead of coming here for 2 separate trips. To schedule his next appointment early morning and come fasting to the appointment so we can do labs while he is here at the visit.  5. Right acute serous otitis media, recurrence not specified - amoxicillin-clavulanate (AUGMENTIN) 875-125 MG per tablet; Take 1 tablet by mouth 2 (  two) times daily.  Dispense: 20 tablet; Refill: 0 Take all of abx. F/y if sx do not resolve after completion of abx. Also use otc decongestant.   HE WILL TURN 49 08/18/2014---AFTER THAT WILL HAVE TO DISCUSS PSA AND COLONOSCOPY   Signed, [facility], [place], [facility] 01/07/2014 10:34 AM

## 2014-05-27 ENCOUNTER — Ambulatory Visit (INDEPENDENT_AMBULATORY_CARE_PROVIDER_SITE_OTHER): Payer: Managed Care, Other (non HMO) | Admitting: Family Medicine

## 2014-05-27 ENCOUNTER — Encounter: Payer: Self-pay | Admitting: Family Medicine

## 2014-05-27 VITALS — BP 110/74 | HR 80 | Temp 98.5°F | Resp 18 | Ht 71.0 in | Wt 304.0 lb

## 2014-05-27 DIAGNOSIS — N50812 Left testicular pain: Secondary | ICD-10-CM

## 2014-05-27 DIAGNOSIS — N508 Other specified disorders of male genital organs: Secondary | ICD-10-CM | POA: Diagnosis not present

## 2014-05-27 DIAGNOSIS — N451 Epididymitis: Secondary | ICD-10-CM | POA: Diagnosis not present

## 2014-05-27 MED ORDER — DOXYCYCLINE HYCLATE 100 MG PO TABS: 100.0000 mg | ORAL_TABLET | Freq: Two times a day (BID) | ORAL | Status: DC

## 2014-05-27 NOTE — Progress Notes (Signed)
   Subjective:    Patient ID: Jorge Bowman, male    DOB: [birth year], 49 y.o.   MRN: [id number]  HPI Patient has had one month of pain in his left testicle. The pain began after he experienced a severe stomach virus and was retching and vomiting frequently. He is concerned he may have an inguinal hernia. On examination today there is no inguinal hernia. There is no ventral hernia in the suprapubic area. However the patient is exquisitely tender to palpation on the superior testicle and in the distal spermatic cord. He describes the pain is though someone just thumped him on the testicle. It is worse with walking and activity. He denies any dysuria. He denies any hematuria. He denies any penile discharge. He denies any exposure to gonorrhea or chlamydia. Past Medical History  Diagnosis Date  . Hyperlipidemia   . Hypertension   . Obesity   . Family history of premature coronary artery disease 10/09/2012   No past surgical history on file. Current Outpatient Prescriptions on File Prior to Visit  Medication Sig Dispense Refill  . atorvastatin (LIPITOR) 80 MG tablet Take 1 tablet (80 mg total) by mouth at bedtime. 30 tablet 5  . lisinopril-hydrochlorothiazide (PRINZIDE,ZESTORETIC) 20-25 MG per tablet TAKE ONE TABLET BY MOUTH EVERY DAY 30 tablet 5  . Multiple Vitamin (MULTIVITAMIN) tablet Take 1 tablet by mouth daily.     No current facility-administered medications on file prior to visit.   No Known Allergies History   Social History  . Marital Status: Legally Separated    Spouse Name: N/A  . Number of Children: N/A  . Years of Education: N/A   Occupational History  . Not on file.   Social History Main Topics  . Smoking status: Current Every Day Smoker -- 1.00 packs/day    Types: Cigarettes  . Smokeless tobacco: Never Used  . Alcohol Use: No  . Drug Use: No  . Sexual Activity: Not on file   Other Topics Concern  . 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      Review of Systems  All other systems reviewed and are negative.      Objective:   Physical Exam  Cardiovascular: Normal rate and regular rhythm.   Pulmonary/Chest: Effort normal and breath sounds normal.  Abdominal: Hernia confirmed negative in the right inguinal area and confirmed negative in the left inguinal area.  Genitourinary: Right testis shows no mass, no swelling and no tenderness. Left testis shows tenderness. Left testis shows no mass.  Lymphadenopathy:       Right: No inguinal adenopathy present.       Left: No inguinal adenopathy present.  Vitals reviewed.  patient is very tender over the epididymis and distal spermatic cord        Assessment & Plan:  Epididymitis - Plan: doxycycline (VIBRA-TABS) 100 MG tablet  Testicular pain, left  Differential diagnosis includes chronic testicular pain due to irritation versus epididymitis. I recommended doxycycline 100 mg by mouth twice a day for 10 days. Also recommended he switch his undergarments from boxers to more supportive white briefs.  Recheck in 2 weeks if no better or sooner if worse. Consider scrotal ultrasound if persistent.

## 2014-06-19 ENCOUNTER — Telehealth: Payer: Self-pay | Admitting: Family Medicine

## 2014-06-19 DIAGNOSIS — E279 Disorder of adrenal gland, unspecified: Principal | ICD-10-CM

## 2014-06-19 DIAGNOSIS — N451 Epididymitis: Secondary | ICD-10-CM

## 2014-06-19 DIAGNOSIS — E278 Other specified disorders of adrenal gland: Secondary | ICD-10-CM

## 2014-06-19 MED ORDER — DOXYCYCLINE HYCLATE 100 MG PO TABS: 100.0000 mg | ORAL_TABLET | Freq: Two times a day (BID) | ORAL | Status: DC

## 2014-06-19 NOTE — Telephone Encounter (Signed)
Pt aware and med sent to pharm 

## 2014-06-19 NOTE — Telephone Encounter (Signed)
Pt completed the Doxycycline and was fine - his pain resolved - but now the pain has returned - he states that Dr. Dennard Schaumann told him if it come back he would do a second round of Doxy and if it was no better after that he would either get an ultrasound or send him to see urology. Pt is wanting to do another round of Doxy if possible. MD please advise.

## 2014-06-19 NOTE — Telephone Encounter (Signed)
Okay to refill, if not improved needs Urology referral

## 2014-06-19 NOTE — Telephone Encounter (Signed)
I reviewed CT report from 06/22/13. They did recommend follow-up scan in 12 months. Approved.

## 2014-06-19 NOTE — Telephone Encounter (Signed)
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  1 year follow up recommended.  Pt ready for [place] to schedule.  Order placed.

## 2014-06-24 ENCOUNTER — Other Ambulatory Visit: Payer: Self-pay | Admitting: Physician Assistant

## 2014-06-24 DIAGNOSIS — E279 Disorder of adrenal gland, unspecified: Principal | ICD-10-CM

## 2014-06-24 DIAGNOSIS — E278 Other specified disorders of adrenal gland: Secondary | ICD-10-CM

## 2014-06-28 ENCOUNTER — Ambulatory Visit (HOSPITAL_COMMUNITY): Payer: Managed Care, Other (non HMO)

## 2014-06-28 ENCOUNTER — Ambulatory Visit (HOSPITAL_COMMUNITY)
Admission: RE | Admit: 2014-06-28 | Discharge: 2014-06-28 | Disposition: A | Discharge status: Home / Self Care | Payer: Managed Care, Other (non HMO) | Source: Ambulatory Visit | Attending: Physician Assistant | Admitting: Physician Assistant

## 2014-06-28 DIAGNOSIS — I10 Essential (primary) hypertension: Secondary | ICD-10-CM | POA: Insufficient documentation

## 2014-06-28 DIAGNOSIS — E279 Disorder of adrenal gland, unspecified: Secondary | ICD-10-CM | POA: Diagnosis present

## 2014-06-28 DIAGNOSIS — E278 Other specified disorders of adrenal gland: Secondary | ICD-10-CM

## 2014-06-28 MED ORDER — IOHEXOL 300 MG/ML  SOLN
100.0000 mL | Freq: Once | INTRAMUSCULAR | Status: AC | PRN
  Administered 2014-06-28: 100 mL via INTRAVENOUS

## 2014-07-11 ENCOUNTER — Ambulatory Visit (INDEPENDENT_AMBULATORY_CARE_PROVIDER_SITE_OTHER): Payer: Managed Care, Other (non HMO) | Admitting: Physician Assistant

## 2014-07-11 ENCOUNTER — Encounter: Payer: Self-pay | Admitting: Physician Assistant

## 2014-07-11 VITALS — BP 110/60 | HR 74 | Temp 98.5°F | Resp 19 | Wt 285.0 lb

## 2014-07-11 DIAGNOSIS — Z720 Tobacco use: Secondary | ICD-10-CM | POA: Diagnosis not present

## 2014-07-11 DIAGNOSIS — F17200 Nicotine dependence, unspecified, uncomplicated: Secondary | ICD-10-CM | POA: Insufficient documentation

## 2014-07-11 DIAGNOSIS — E785 Hyperlipidemia, unspecified: Secondary | ICD-10-CM

## 2014-07-11 DIAGNOSIS — R1900 Intra-abdominal and pelvic swelling, mass and lump, unspecified site: Secondary | ICD-10-CM

## 2014-07-11 DIAGNOSIS — Z8249 Family history of ischemic heart disease and other diseases of the circulatory system: Secondary | ICD-10-CM | POA: Diagnosis not present

## 2014-07-11 DIAGNOSIS — I10 Essential (primary) hypertension: Secondary | ICD-10-CM

## 2014-07-11 DIAGNOSIS — E669 Obesity, unspecified: Secondary | ICD-10-CM | POA: Diagnosis not present

## 2014-07-11 LAB — COMPLETE METABOLIC PANEL WITH GFR
ALBUMIN: 4.2 g/dL (ref 3.5–5.2)
ALK PHOS: 99 U/L (ref 39–117)
ALT: 13 U/L (ref 0–53)
AST: 16 U/L (ref 0–37)
BILIRUBIN TOTAL: 0.5 mg/dL (ref 0.2–1.2)
BUN: 15 mg/dL (ref 6–23)
CO2: 28 mEq/L (ref 19–32)
Calcium: 9.5 mg/dL (ref 8.4–10.5)
Chloride: 97 mEq/L (ref 96–112)
Creat: 0.97 mg/dL (ref 0.50–1.35)
GFR, Est African American: >89 mL/min
GFR, Est Non African American: >89 mL/min
Glucose, Bld: 84 mg/dL (ref 70–99)
POTASSIUM: 4.9 meq/L (ref 3.5–5.3)
SODIUM: 134 meq/L — AB (ref 135–145)
TOTAL PROTEIN: 6.9 g/dL (ref 6.0–8.3)

## 2014-07-11 LAB — LIPID PANEL
Cholesterol: 132 mg/dL (ref 0–200)
HDL: 32 mg/dL — ABNORMAL LOW (ref >=40)
LDL CALC: 76 mg/dL (ref 0–99)
TRIGLYCERIDES: 122 mg/dL (ref <150)
Total CHOL/HDL Ratio: 4.1 Ratio
VLDL: 24 mg/dL (ref 0–40)

## 2014-07-11 NOTE — Progress Notes (Signed)
Patient ID: Jorge Bowman MRN: [id number], DOB: 1965-07-30, 49 y.o. Date of Encounter: @DATE @  Chief Complaint:  Chief Complaint  Patient presents with  . med. Check    HPI: 49 y.o. year old obese white male  presents for routine followup office visit.  Hypertension: He is taking medication as directed. No adverse effects. No lightheadedness. No lower extremity edema. He checks his blood pressure at home and gets good readings.  Hyperlipidemia: He is taking Lipitor 80 mg as directed. He has no myalgias and no other adverse effects.  At his OV  10/09/12 --He reported that he knew that  his weight was up  compared to the prior office visit. 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 He reported a very sedentary lifestyle with no exercise. Also,  he said he had cut out sodas but otherwise had a very difficult time consuming a good diet.  At [place] 06/2013: He says that he has been exercising every day for at least 30 minutes sometimes up to 90 minutes. Says he does  " Power Walking".  Says that he sometimes walks outside. Says he also has a treadmill at home that he can use if needed. Says that when he is traveling and is in a hotel-- he turns the heat to 80 and walks back and forth across the hotel room!!  Says he likes to keep up with the news so  he turns on the news on TV and  walks back and forth while he listens to that--says it keep shim entertained!!   At [place] 01/2014--he says that he is no longer traveling--is home at nights now---but is using his treadmill about 3 days / week.    Says he is smoking about the same amount as at the last visit. Says this is about  1 - 1 1/2 ppd.  When he does any physical exertion he reports that he has no chest pressure tightness or heaviness he has no increased shortness of breath or dyspnea on exertion .   Past Medical History  Diagnosis Date  . Hyperlipidemia   . Hypertension   . Obesity   . Family history of premature coronary  artery disease 10/09/2012     Home Meds: Outpatient Prescriptions Prior to Visit  Medication Sig Dispense Refill  . atorvastatin (LIPITOR) 80 MG tablet Take 1 tablet (80 mg total) by mouth at bedtime. 30 tablet 5  . lisinopril-hydrochlorothiazide (PRINZIDE,ZESTORETIC) 20-25 MG per tablet TAKE ONE TABLET BY MOUTH EVERY DAY 30 tablet 5  . Multiple Vitamin (MULTIVITAMIN) tablet Take 1 tablet by mouth daily.    Marland Kitchen doxycycline (VIBRA-TABS) 100 MG tablet Take 1 tablet (100 mg total) by mouth 2 (two) times daily. (Patient not taking: Reported on 07/11/2014) 20 tablet 0   No facility-administered medications prior to visit.       Allergies: No Known Allergies  History   Social History  . Marital Status: Legally Separated    Spouse Name: N/A  . Number of Children: N/A  . Years of Education: N/A   Occupational History  . Not on file.   Social History Main Topics  . Smoking status: Current Every Day Smoker -- 1.00 packs/day    Types: Cigarettes  . Smokeless tobacco: Never Used  . Alcohol Use: No  . Drug Use: No  . Sexual Activity: Not on file   Other Topics Concern  . 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    Family History  Problem Relation Age of Onset  . Heart disease Father 58    stent     Review of Systems:  See HPI for pertinent ROS. All other ROS negative.    Physical Exam: Blood pressure 110/60, pulse 74, temperature 98.5 F (36.9 C), temperature source Oral, resp. rate 19, weight 285 lb (129.275 kg)., Body mass index is 39.77 kg/(m^2). General: An obese white male .Appears in no acute distress. Neck: Supple. No thyromegaly. No lymphadenopathy. No carotid bruit. Lungs: Clear bilaterally to auscultation without wheezes, rales, or rhonchi. Breathing is unlabored. Heart: RRR with S1 S2. No murmurs, rubs, or gallops. Abdomen: Soft, non-tender, non-distended with normoactive bowel sounds. No  hepatomegaly. No rebound/guarding. No obvious abdominal masses. Musculoskeletal:  Strength and tone normal for age. Extremities/Skin: Warm and dry.  No edema.  Neuro: Alert and oriented X 3. Moves all extremities spontaneously. Gait is normal. CNII-XII grossly in tact. Psych:  Responds to questions appropriately with a normal affect.      ASSESSMENT AND PLAN:  49 y.o. year old male with  1. Family history of premature coronary artery disease Father with history of CAD. Father had stents and first diagnosed CAD in his 84s.  2. Hyperlipidemia At goal last check. Recheck now.   - atorvastatin (LIPITOR) 80 MG tablet; Take 1 tablet (80 mg total) by mouth at bedtime.  Dispense: 30 tablet; Refill: 5  3. Hypertension At goal. Check lab to monitor now. Continue current medication. Followup office visit and lab in 6 months. - lisinopril-hydrochlorothiazide (PRINZIDE,ZESTORETIC) 20-25 MG per tablet; TAKE ONE TABLET BY MOUTH EVERY DAY  Dispense: 30 tablet; Refill: 5  4. Obesity CONGRATS!!--Good Job with the weight loss and exercise!! At office visit 10/2012 weight was 317.  06/2013 is 303. !! 01/2014--305---says had vacation in October, then Thanksgiving--try to keep this controlled!!! 07/2014---285---great!!  5. Smoker unmotivated to quit He is not interested in using Chantix. I discussed possibly using Wellbutrin which could possibly help curb his cravings for her nicotine and smoking as well as curb his appetite for food. He says he will consider this but is not interested in pursuing this today. Discussed with him trying to at least decrease the amount of cigarettes he is smoking per day even if he cannot quit. We Had the same conversation at last office visit and had the same conversation again today. He has not decreased his smoking at all.  Have discussed with him that especially given his father's family history he really needs to do everything he can to decrease his risk for developing  cardiac disease. He really needs to improve his diet and exercise and weight loss and off it he really needs to stop smoking to help reduce this risk. Good job on the exercise and weight loss!!   discussed that he needs to have a pneumonia vaccine given his smoking. He defers. Discussed that he would only need this 1 pneumonia vaccine and he would not need another one until age 24. He says that he will research this and think about it and let me know at  the next visit.  Also discussed tetanus vaccine. Says he has not had one in greater than 10 years. He defers this today.  Discussed vaccines at length 06/2013 and again 01/2014---still defers.   Fasting labs and regular office visit in 6 months or sooner if he needs [place]. He states that he  prefers to have his labs done while he is here at the visit instead of coming here for 2 separate trips. To schedule his next appointment early morning and come fasting to the appointment so we can do labs while he is here at the visit.   HE WILL TURN 49 08/18/2014---AFTER THAT WILL HAVE TO DISCUSS PSA AND COLONOSCOPY   Signed, Karis Juba, [place], [facility] 07/11/2014 8:51 AM

## 2014-07-12 ENCOUNTER — Encounter: Payer: Self-pay | Admitting: *Deleted

## 2014-07-28 ENCOUNTER — Other Ambulatory Visit: Payer: Self-pay | Admitting: Physician Assistant

## 2014-07-29 ENCOUNTER — Encounter: Payer: Self-pay | Admitting: Physician Assistant

## 2014-07-29 DIAGNOSIS — I10 Essential (primary) hypertension: Secondary | ICD-10-CM

## 2014-07-29 DIAGNOSIS — E785 Hyperlipidemia, unspecified: Secondary | ICD-10-CM

## 2014-07-29 MED ORDER — LISINOPRIL-HYDROCHLOROTHIAZIDE 20-25 MG PO TABS: ORAL_TABLET | ORAL | Status: DC

## 2014-07-29 MED ORDER — ATORVASTATIN CALCIUM 80 MG PO TABS: 80.0000 mg | ORAL_TABLET | Freq: Every day | ORAL | Status: DC

## 2015-01-16 ENCOUNTER — Encounter: Payer: Self-pay | Admitting: Physician Assistant

## 2015-01-16 ENCOUNTER — Ambulatory Visit (INDEPENDENT_AMBULATORY_CARE_PROVIDER_SITE_OTHER): Payer: Managed Care, Other (non HMO) | Admitting: Physician Assistant

## 2015-01-16 VITALS — BP 134/90 | HR 72 | Temp 98.4°F | Resp 18 | Wt 274.0 lb

## 2015-01-16 DIAGNOSIS — E669 Obesity, unspecified: Secondary | ICD-10-CM | POA: Diagnosis not present

## 2015-01-16 DIAGNOSIS — Z125 Encounter for screening for malignant neoplasm of prostate: Secondary | ICD-10-CM | POA: Insufficient documentation

## 2015-01-16 DIAGNOSIS — Z8249 Family history of ischemic heart disease and other diseases of the circulatory system: Secondary | ICD-10-CM | POA: Diagnosis not present

## 2015-01-16 DIAGNOSIS — B9689 Other specified bacterial agents as the cause of diseases classified elsewhere: Secondary | ICD-10-CM

## 2015-01-16 DIAGNOSIS — I10 Essential (primary) hypertension: Secondary | ICD-10-CM

## 2015-01-16 DIAGNOSIS — J988 Other specified respiratory disorders: Secondary | ICD-10-CM

## 2015-01-16 DIAGNOSIS — Z720 Tobacco use: Secondary | ICD-10-CM | POA: Diagnosis not present

## 2015-01-16 DIAGNOSIS — F17200 Nicotine dependence, unspecified, uncomplicated: Secondary | ICD-10-CM

## 2015-01-16 DIAGNOSIS — E785 Hyperlipidemia, unspecified: Secondary | ICD-10-CM

## 2015-01-16 LAB — COMPLETE METABOLIC PANEL WITH GFR
ALBUMIN: 4.1 g/dL (ref 3.6–5.1)
ALT: 12 U/L (ref 9–46)
AST: 14 U/L (ref 10–35)
Alkaline Phosphatase: 89 U/L (ref 40–115)
BILIRUBIN TOTAL: 0.4 mg/dL (ref 0.2–1.2)
BUN: 16 mg/dL (ref 7–25)
CO2: 28 mmol/L (ref 20–31)
Calcium: 9.3 mg/dL (ref 8.6–10.3)
Chloride: 101 mmol/L (ref 98–110)
Creat: 0.84 mg/dL (ref 0.70–1.33)
GFR, Est African American: >89 mL/min (ref >=60)
GFR, Est Non African American: >89 mL/min (ref >=60)
GLUCOSE: 93 mg/dL (ref 70–99)
Potassium: 4.3 mmol/L (ref 3.5–5.3)
SODIUM: 136 mmol/L (ref 135–146)
TOTAL PROTEIN: 6.7 g/dL (ref 6.1–8.1)

## 2015-01-16 LAB — LIPID PANEL
Cholesterol: 142 mg/dL (ref 125–200)
HDL: 33 mg/dL — ABNORMAL LOW (ref >=40)
LDL Cholesterol: 81 mg/dL (ref <130)
Total CHOL/HDL Ratio: 4.3 Ratio (ref <=5.0)
Triglycerides: 141 mg/dL (ref <150)
VLDL: 28 mg/dL (ref <30)

## 2015-01-16 MED ORDER — AZITHROMYCIN 250 MG PO TABS: ORAL_TABLET | ORAL | Status: DC

## 2015-01-16 NOTE — Progress Notes (Signed)
Patient ID: Jorge Bowman MRN: [id number], DOB: May 31, 1965, 49 y.o. Date of Encounter: @DATE @  Chief Complaint:  Chief Complaint  Patient presents with  . fasting labs/check up  . sick x 3 weeks    cough, congestion, ear ache    HPI: 49 y.o. year old obese white male  presents for routine followup office visit.  Says that he has been sick for 3 or 4 weeks now. Says he has head and chest congestion and also his right ear has been feeling clogged. Says that he has had problems with ear wax in the past he has been putting drops in his ear for that but really hasn't gotten relief of the discomfort he feels. Has had no fevers or chills and no significant sore throat.  Hypertension: He is taking medication as directed. No adverse effects. No lightheadedness. No lower extremity edema. He checks his blood pressure at home and gets good readings.  Hyperlipidemia: He is taking Lipitor 80 mg as directed. He has no myalgias and no other adverse effects.  At his OV  10/09/12 --He reported that he knew that  his weight was up  compared to the prior office visit. 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 He reported a very sedentary lifestyle with no exercise. Also,  he said he had cut out sodas but otherwise had a very difficult time consuming a good diet.  At [place] 06/2013: He says that he has been exercising every day for at least 30 minutes sometimes up to 90 minutes. Says he does  " Power Walking".  Says that he sometimes walks outside. Says he also has a treadmill at home that he can use if needed. Says that when he is traveling and is in a hotel-- he turns the heat to 80 and walks back and forth across the hotel room!!  Says he likes to keep up with the news so  he turns on the news on TV and  walks back and forth while he listens to that--says it keep shim entertained!!   At [place] 01/2014--he says that he is no longer traveling--is home at nights now---but is using his treadmill about 3  days / week.  At [place] 01/2015 he says that he is still at home at nights. Says that he uses his treadmill sometimes just 2 or 3 times per week but sometimes 5 times per week.  Has continued to lose weight. Weight today down to 274.   Says he is smoking about the same amount as at the last visit. Says this is about  1 - 1 1/2 ppd.  When he does any physical exertion he reports that he has no chest pressure tightness or heaviness he has no increased shortness of breath or dyspnea on exertion .   Past Medical History  Diagnosis Date  . Hyperlipidemia   . Hypertension   . Obesity   . Family history of premature coronary artery disease 10/09/2012     Home Meds: Outpatient Prescriptions Prior to Visit  Medication Sig Dispense Refill  . atorvastatin (LIPITOR) 80 MG tablet Take 1 tablet (80 mg total) by mouth at bedtime. 30 tablet 5  . lisinopril-hydrochlorothiazide (PRINZIDE,ZESTORETIC) 20-25 MG per tablet TAKE ONE TABLET BY MOUTH EVERY DAY 30 tablet 5  . Multiple Vitamin (MULTIVITAMIN) tablet Take 1 tablet by mouth daily.     No facility-administered medications prior to visit.       Allergies: No Known Allergies  Social History   Social History  . Marital Status: Legally Separated    Spouse Name: N/A  . Number of Children: N/A  . Years of Education: N/A   Occupational History  . Not on file.   Social History Main Topics  . Smoking status: Current Every Day Smoker -- 1.00 packs/day    Types: Cigarettes  . Smokeless tobacco: Never Used  . Alcohol Use: No  . Drug Use: No  . Sexual Activity: Not on file   Other Topics Concern  . 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    Family History  Problem Relation Age of Onset  . Heart disease Father 35    stent     Review of Systems:  See HPI for pertinent ROS. All other ROS negative.    Physical Exam: Blood pressure 134/90, pulse 72, temperature 98.4  F (36.9 C), temperature source Oral, resp. rate 18, weight 274 lb (124.286 kg)., Body mass index is 38.23 kg/(m^2). General: Obese white male .Appears in no acute distress. HEENT: Ears: Left ear canal and tympanic membrane normal. Right ear canal normal and patent. However the tympanic membrane does show fluid behind the TM. Also retracted. Posterior pharynx and tonsils appear normal with minimal erythema and no exudate and no peritonsillar abscess. Neck: Supple. No thyromegaly. No lymphadenopathy. No carotid bruit. Lungs: Clear bilaterally to auscultation without wheezes, rales, or rhonchi. Breathing is unlabored. Heart: RRR with S1 S2. No murmurs, rubs, or gallops. Abdomen: Soft, non-tender, non-distended with normoactive bowel sounds. No hepatomegaly. No rebound/guarding. No obvious abdominal masses. Musculoskeletal:  Strength and tone normal for age. Extremities/Skin: Warm and dry.  No edema.  Neuro: Alert and oriented X 3. Moves all extremities spontaneously. Gait is normal. CNII-XII grossly in tact. Psych:  Responds to questions appropriately with a normal affect.      ASSESSMENT AND PLAN:  49 y.o. year old male with  1. Family history of premature coronary artery disease Father with history of CAD. Father had stents and first diagnosed CAD in his 85s.  2. Hyperlipidemia At goal last check. Recheck now.   - atorvastatin (LIPITOR) 80 MG tablet; Take 1 tablet (80 mg total) by mouth at bedtime.  Dispense: 30 tablet; Refill: 5  3. Hypertension At goal. Check lab to monitor now. Continue current medication. Followup office visit and lab in 6 months. - lisinopril-hydrochlorothiazide (PRINZIDE,ZESTORETIC) 20-25 MG per tablet; TAKE ONE TABLET BY MOUTH EVERY DAY  Dispense: 30 tablet; Refill: 5  4. Obesity CONGRATS!!--Good Job with the weight loss and exercise!! At office visit 10/2012 weight was 317.  06/2013 is 303. !! 01/2014--305---says had vacation in October, then Thanksgiving--try to  keep this controlled!!! 07/2014---285---great!! 01/2015---274 !!  5. Smoker unmotivated to quit He is not interested in using Chantix. I discussed possibly using Wellbutrin which could possibly help curb his cravings for her nicotine and smoking as well as curb his appetite for food. He says he will consider this but is not interested in pursuing this today. Discussed with him trying to at least decrease the amount of cigarettes he is smoking per day even if he cannot quit. We Had the same conversation at last office visit and had the same conversation again today. He has not decreased his smoking at all.  Have discussed with him that especially given his father's family history he really needs to do everything he can to decrease  his risk for developing cardiac disease. He really needs to improve his diet and exercise and weight loss and off it he really needs to stop smoking to help reduce this risk. Good job on the exercise and weight loss!!  6. Bacterial respiratory infection He is to take the antibiotic as directed. If symptoms do not resolve within 1 week after completion of the antibiotic he needs to follow-up with [place]. - azithromycin (ZITHROMAX) 250 MG tablet; Day 1: Take 2 daily.  Days 2-5: Take 1 daily.  Dispense: 6 tablet; Refill: 0  7. Prostate cancer screening Discussed with him that he is now age 53 so should have prostate cancer screening so will do PSA with lab. He is agreeable. - PSA  8. Colorectal Cancer Screening At office visit 01/16/15 he is now age 10. I discussed colonoscopy but he defers right now and says that he will think about it. Then discussed Hemoccults but still he says that he will think about this but does not want to have the Hemoccult cards today.  9. Immunizations:  discussed that he needs to have a pneumonia vaccine given his smoking. He defers. Discussed that he would only need this 1 pneumonia vaccine and he would not need another one until age 25. He says  that he will research this and think about it and let me know at  the next visit.  Also discussed tetanus vaccine. Says he has not had one in greater than 10 years. He defers this today.  Discussed vaccines at length 06/2013 and again 01/2014---still defers.   At [place] 01/2015--he is sick with bacterial respiratory infection so no immunizations today and just did not even discuss immunizations again today we will revisit this in the future.  Fasting labs and regular office visit in 6 months or sooner if he needs [place]. He states that he prefers to have his labs done while he is here at the visit instead of coming here for 2 separate trips. To schedule his next appointment early morning and come fasting to the appointment so we can do labs while he is here at the visit.    Marin Olp Dubberly, [place], [facility] 01/16/2015 8:24 AM

## 2015-01-17 LAB — PSA: PSA: 0.34 ng/mL (ref <=4.00)

## 2015-01-21 ENCOUNTER — Encounter: Payer: Self-pay | Admitting: Family Medicine

## 2015-02-03 ENCOUNTER — Other Ambulatory Visit: Payer: Self-pay | Admitting: Physician Assistant

## 2015-02-04 NOTE — Telephone Encounter (Signed)
Medication refilled per protocol. 

## 2015-04-07 ENCOUNTER — Encounter: Payer: Self-pay | Admitting: Family Medicine

## 2015-04-07 ENCOUNTER — Ambulatory Visit (INDEPENDENT_AMBULATORY_CARE_PROVIDER_SITE_OTHER): Payer: Managed Care, Other (non HMO) | Admitting: Family Medicine

## 2015-04-07 VITALS — BP 118/78 | HR 82 | Temp 98.3°F | Resp 20 | Ht 71.0 in | Wt 284.0 lb

## 2015-04-07 DIAGNOSIS — M5010 Cervical disc disorder with radiculopathy, unspecified cervical region: Secondary | ICD-10-CM | POA: Diagnosis not present

## 2015-04-07 MED ORDER — PREDNISONE 20 MG PO TABS: ORAL_TABLET | ORAL | Status: DC

## 2015-04-08 ENCOUNTER — Encounter: Payer: Self-pay | Admitting: Family Medicine

## 2015-04-08 NOTE — Progress Notes (Signed)
   Subjective:    Patient ID: Jorge Bowman, male    DOB: [birth year], 50 y.o.   MRN: [id number]  HPI Patient reports right-sided shoulder pain for the last 6 weeks. It is gradually getting worse. The pain radiates from his neck along the right side into his right shoulder down his right arm into his right hand. He reports numbness and tingling in the right hand. He denies any weakness in the arm. He has full and painless range of motion in his right shoulder. Abduction is unlimited and without pain. He has a negative empty can sign. He has a negative Hawkins sign. He has a negative O'Brien sign. There is no weakness in the shoulder. However he does complain of a dull deep constant pain in the shoulder radiating down into his upper arm and forearm. On examination today he has diminished reflexes when checked at the biceps as well as the brachioradialis when compared to his left arm. He also has a positive Spurling sign Past Medical History  Diagnosis Date  . Hyperlipidemia   . Hypertension   . Obesity   . Family history of premature coronary artery disease 10/09/2012   No past surgical history on file. Current Outpatient Prescriptions on File Prior to Visit  Medication Sig Dispense Refill  . atorvastatin (LIPITOR) 80 MG tablet Take 1 tablet (80 mg total) by mouth at bedtime. 30 tablet 5  . lisinopril-hydrochlorothiazide (PRINZIDE,ZESTORETIC) 20-25 MG tablet TAKE ONE TABLET ONCE DAILY 90 tablet 1  . Multiple Vitamin (MULTIVITAMIN) tablet Take 1 tablet by mouth daily.     No current facility-administered medications on file prior to visit.   No Known Allergies Social History   Social History  . Marital Status: Legally Separated    Spouse Name: N/A  . Number of Children: N/A  . Years of Education: N/A   Occupational History  . Not on file.   Social History Main Topics  . Smoking status: Current Every Day Smoker -- 1.00 packs/day    Types: Cigarettes  . Smokeless tobacco: Never Used  .  Alcohol Use: No  . Drug Use: No  . Sexual Activity: Not on file   Other Topics Concern  . 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      Review of Systems  All other systems reviewed and are negative.      Objective:   Physical Exam  Cardiovascular: Normal rate, regular rhythm and normal heart sounds.   Pulmonary/Chest: Effort normal and breath sounds normal.  Musculoskeletal:       Right shoulder: He exhibits pain. He exhibits normal range of motion, no tenderness, no bony tenderness, no swelling, no effusion, no crepitus, no spasm and normal strength.       Right elbow: He exhibits normal range of motion, no swelling and no effusion. No tenderness found.       Cervical back: He exhibits pain. He exhibits normal range of motion, no tenderness and no bony tenderness.  Vitals reviewed.         Assessment & Plan:  Cervical disc disorder with radiculopathy of cervical region - Plan: predniSONE (DELTASONE) 20 MG tablet  I believe his pain is referred pain likely cervical radiculopathy. Begin a prednisone taper pack and recheck in one week. If pain persists, I would proceed with imaging of the neck.

## 2015-04-28 ENCOUNTER — Encounter: Payer: Self-pay | Admitting: Family Medicine

## 2015-04-28 ENCOUNTER — Ambulatory Visit (INDEPENDENT_AMBULATORY_CARE_PROVIDER_SITE_OTHER): Payer: Managed Care, Other (non HMO) | Admitting: Family Medicine

## 2015-04-28 ENCOUNTER — Ambulatory Visit (HOSPITAL_COMMUNITY)
Admission: RE | Admit: 2015-04-28 | Discharge: 2015-04-28 | Disposition: A | Discharge status: Home / Self Care | Payer: Managed Care, Other (non HMO) | Source: Ambulatory Visit | Attending: Family Medicine | Admitting: Family Medicine

## 2015-04-28 VITALS — BP 138/74 | HR 80 | Temp 98.0°F | Resp 18 | Ht 71.0 in | Wt 290.0 lb

## 2015-04-28 DIAGNOSIS — M47896 Other spondylosis, lumbar region: Secondary | ICD-10-CM | POA: Insufficient documentation

## 2015-04-28 DIAGNOSIS — M47892 Other spondylosis, cervical region: Secondary | ICD-10-CM | POA: Diagnosis not present

## 2015-04-28 DIAGNOSIS — M5441 Lumbago with sciatica, right side: Secondary | ICD-10-CM | POA: Insufficient documentation

## 2015-04-28 DIAGNOSIS — M5010 Cervical disc disorder with radiculopathy, unspecified cervical region: Secondary | ICD-10-CM

## 2015-04-28 DIAGNOSIS — M4686 Other specified inflammatory spondylopathies, lumbar region: Secondary | ICD-10-CM | POA: Insufficient documentation

## 2015-04-28 DIAGNOSIS — M4316 Spondylolisthesis, lumbar region: Secondary | ICD-10-CM | POA: Insufficient documentation

## 2015-04-28 MED ORDER — DICLOFENAC SODIUM 75 MG PO TBEC: 75.0000 mg | DELAYED_RELEASE_TABLET | Freq: Two times a day (BID) | ORAL | Status: DC

## 2015-04-28 NOTE — Progress Notes (Signed)
Subjective:    Patient ID: Jorge Bowman, male    DOB: July 19, 1965, 50 y.o.   MRN: [id number]  Back Pain  04/07/15 Patient reports right-sided shoulder pain for the last 6 weeks. It is gradually getting worse. The pain radiates from his neck along the right side into his right shoulder down his right arm into his right hand. He reports numbness and tingling in the right hand. He denies any weakness in the arm. He has full and painless range of motion in his right shoulder. Abduction is unlimited and without pain. He has a negative empty can sign. He has a negative Hawkins sign. He has a negative O'Brien sign. There is no weakness in the shoulder. However he does complain of a dull deep constant pain in the shoulder radiating down into his upper arm and forearm. On examination today he has diminished reflexes when checked at the biceps as well as the brachioradialis when compared to his left arm. He also has a positive Spurling sign.  At that time, my plan was: I believe his pain is referred pain likely cervical radiculopathy. Begin a prednisone taper pack and recheck in one week. If pain persists, I would proceed with imaging of the neck.  04/28/15 Patient states the pain in his neck and right shoulder improved while on the prednisone. However the pain has gradually returned since discontinuing the prednisone. He continues to have pain radiating from his neck down into his right posterior shoulder. He is also developed pain in his lower back radiating into his posterior right hip down his posterior right thigh and then wrapping around his anterior right thigh to below his knee in the distribution of the L3 dermatome. It is worse with prolonged standing and walking. Past Medical History  Diagnosis Date  . Hyperlipidemia   . Hypertension   . Obesity   . Family history of premature coronary artery disease 10/09/2012   No past surgical history on file. Current Outpatient Prescriptions on File Prior to  Visit  Medication Sig Dispense Refill  . atorvastatin (LIPITOR) 80 MG tablet Take 1 tablet (80 mg total) by mouth at bedtime. 30 tablet 5  . lisinopril-hydrochlorothiazide (PRINZIDE,ZESTORETIC) 20-25 MG tablet TAKE ONE TABLET ONCE DAILY 90 tablet 1  . Multiple Vitamin (MULTIVITAMIN) tablet Take 1 tablet by mouth daily.     No current facility-administered medications on file prior to visit.   No Known Allergies Social History   Social History  . Marital Status: Legally Separated    Spouse Name: N/A  . Number of Children: N/A  . Years of Education: N/A   Occupational History  . Not on file.   Social History Main Topics  . Smoking status: Current Every Day Smoker -- 1.00 packs/day    Types: Cigarettes  . Smokeless tobacco: Never Used  . Alcohol Use: No  . Drug Use: No  . Sexual Activity: Not on file   Other Topics Concern  . 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      Review of Systems  Musculoskeletal: Positive for back pain.  All other systems reviewed and are negative.      Objective:   Physical Exam  Cardiovascular: Normal rate, regular rhythm and normal heart sounds.   Pulmonary/Chest: Effort normal and breath sounds normal.  Musculoskeletal:       Right shoulder: He exhibits pain. He exhibits normal range of  motion, no tenderness, no bony tenderness, no swelling, no effusion, no crepitus, no spasm and normal strength.       Right elbow: He exhibits normal range of motion, no swelling and no effusion. No tenderness found.       Cervical back: He exhibits pain. He exhibits normal range of motion, no tenderness and no bony tenderness.  Vitals reviewed.         Assessment & Plan:  Right-sided low back pain with right-sided sciatica - Plan: diclofenac (VOLTAREN) 75 MG EC tablet, DG Lumbar Spine Complete  Cervical disc disorder with radiculopathy of cervical region - Plan: DG Cervical  Spine Complete  I suspect both of these pains are neuropathic in nature and likely due to degenerative disc disease. I will schedule the patient for an x-ray of his neck as well as his lumbar spine to evaluate for degenerative disc disease. Meanwhile I will start the patient on diclofenac 75 mg by mouth twice a day for degenerative disc disease. If x-rays confirm a suspicion and symptoms or not improving on diclofenac, I would next proceed with an MRI of the affected area. Also recommended diet exercise and weight loss as a means to help prevent this from getting worse. We discussed Topamax, Belviq, and contrave.  He will check with his insurance regarding these medications.

## 2015-04-29 ENCOUNTER — Encounter: Payer: Self-pay | Admitting: Family Medicine

## 2015-05-01 ENCOUNTER — Encounter: Payer: Self-pay | Admitting: Family Medicine

## 2015-05-19 ENCOUNTER — Encounter: Payer: Self-pay | Admitting: Physician Assistant

## 2015-05-19 ENCOUNTER — Encounter: Payer: Self-pay | Admitting: Family Medicine

## 2015-05-19 ENCOUNTER — Ambulatory Visit (INDEPENDENT_AMBULATORY_CARE_PROVIDER_SITE_OTHER): Payer: Managed Care, Other (non HMO) | Admitting: Family Medicine

## 2015-05-19 VITALS — BP 120/74 | HR 76 | Temp 98.0°F | Resp 18 | Wt 286.0 lb

## 2015-05-19 DIAGNOSIS — J208 Acute bronchitis due to other specified organisms: Secondary | ICD-10-CM

## 2015-05-19 MED ORDER — AZITHROMYCIN 250 MG PO TABS: ORAL_TABLET | ORAL | Status: DC

## 2015-05-19 NOTE — Progress Notes (Signed)
   Subjective:    Patient ID: Jorge Bowman, male    DOB: 10-27-1965, 50 y.o.   MRN: [id number]  Back Pain  Patient began getting sick last Wednesday. He has had a nonproductive cough. Some mild chest discomfort. Fever from 99-100. He denies any shortness of breath he has been having some head congestion, watery eyes, sneezing, and sinus congestion  Past Medical History  Diagnosis Date  . Hyperlipidemia   . Hypertension   . Obesity   . Family history of premature coronary artery disease 10/09/2012  . DDD (degenerative disc disease), cervical   . DDD (degenerative disc disease), lumbar    No past surgical history on file. Current Outpatient Prescriptions on File Prior to Visit  Medication Sig Dispense Refill  . amoxicillin (AMOXIL) 500 MG capsule Take 500 mg by mouth 2 (two) times daily.    Marland Kitchen atorvastatin (LIPITOR) 80 MG tablet Take 1 tablet (80 mg total) by mouth at bedtime. 30 tablet 5  . diclofenac (VOLTAREN) 75 MG EC tablet Take 1 tablet (75 mg total) by mouth 2 (two) times daily. 60 tablet 2  . lisinopril-hydrochlorothiazide (PRINZIDE,ZESTORETIC) 20-25 MG tablet TAKE ONE TABLET ONCE DAILY 90 tablet 1  . Multiple Vitamin (MULTIVITAMIN) tablet Take 1 tablet by mouth daily.     No current facility-administered medications on file prior to visit.   No Known Allergies Social History   Social History  . Marital Status: Legally Separated    Spouse Name: N/A  . Number of Children: N/A  . Years of Education: N/A   Occupational History  . Not on file.   Social History Main Topics  . Smoking status: Current Every Day Smoker -- 1.00 packs/day    Types: Cigarettes  . Smokeless tobacco: Never Used  . Alcohol Use: No  . Drug Use: No  . Sexual Activity: Not on file   Other Topics Concern  . 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      Review of Systems  All other systems reviewed and are negative.        Objective:   Physical Exam  HENT:  Right Ear: Tympanic membrane and ear canal normal.  Left Ear: Tympanic membrane and ear canal normal.  Nose: Mucosal edema and rhinorrhea present.  Cardiovascular: Normal rate, regular rhythm and normal heart sounds.   Pulmonary/Chest: Effort normal and breath sounds normal. No respiratory distress. He has no wheezes. He has no rales. He exhibits no tenderness.  Vitals reviewed.         Assessment & Plan:  Acute bronchitis due to other specified organisms - Plan: azithromycin (ZITHROMAX) 250 MG tablet  I believe the patient is suffering from a combination of bronchitis as well as seasonal allergies. I recommended Flonase for seasonal allergies. He can use a Z-Pak for bronchitis if his symptoms worsen. However at the present time I believe this is likely viral bronchitis. I recommended tincture of time and Mucinex for cough. Should the cough become productive of purulent sputum he start running higher fevers or developing worsening shortness of breath, I would then like him to start the antibiotic

## 2015-08-07 ENCOUNTER — Encounter: Payer: Self-pay | Admitting: Physician Assistant

## 2015-08-07 ENCOUNTER — Ambulatory Visit (INDEPENDENT_AMBULATORY_CARE_PROVIDER_SITE_OTHER): Payer: Managed Care, Other (non HMO) | Admitting: Physician Assistant

## 2015-08-07 VITALS — BP 112/68 | Temp 98.5°F | Ht 71.0 in | Wt 294.0 lb

## 2015-08-07 DIAGNOSIS — E669 Obesity, unspecified: Secondary | ICD-10-CM

## 2015-08-07 DIAGNOSIS — I10 Essential (primary) hypertension: Secondary | ICD-10-CM | POA: Diagnosis not present

## 2015-08-07 DIAGNOSIS — Z1212 Encounter for screening for malignant neoplasm of rectum: Secondary | ICD-10-CM | POA: Diagnosis not present

## 2015-08-07 DIAGNOSIS — Z8249 Family history of ischemic heart disease and other diseases of the circulatory system: Secondary | ICD-10-CM

## 2015-08-07 DIAGNOSIS — Z1211 Encounter for screening for malignant neoplasm of colon: Secondary | ICD-10-CM | POA: Diagnosis not present

## 2015-08-07 DIAGNOSIS — Z125 Encounter for screening for malignant neoplasm of prostate: Secondary | ICD-10-CM

## 2015-08-07 DIAGNOSIS — F17200 Nicotine dependence, unspecified, uncomplicated: Secondary | ICD-10-CM

## 2015-08-07 DIAGNOSIS — R635 Abnormal weight gain: Secondary | ICD-10-CM | POA: Diagnosis not present

## 2015-08-07 DIAGNOSIS — Z720 Tobacco use: Secondary | ICD-10-CM | POA: Diagnosis not present

## 2015-08-07 DIAGNOSIS — E785 Hyperlipidemia, unspecified: Secondary | ICD-10-CM

## 2015-08-07 MED ORDER — LISINOPRIL-HYDROCHLOROTHIAZIDE 20-25 MG PO TABS: 1.0000 | ORAL_TABLET | Freq: Every day | ORAL | Status: DC

## 2015-08-07 MED ORDER — ATORVASTATIN CALCIUM 80 MG PO TABS: 80.0000 mg | ORAL_TABLET | Freq: Every day | ORAL | Status: DC

## 2015-08-07 NOTE — Progress Notes (Signed)
Patient ID: Jorge Bowman MRN: [id number], DOB: [birth year], 50 y.o. Date of Encounter: @DATE @  Chief Complaint:  Chief Complaint  Patient presents with  . routine follow up    medication refill     HPI: 50 y.o. year old obese white male  presents for routine followup office visit.    At his OV  10/09/12 --He reported that he knew that  his weight was up  compared to the prior office visit. 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 He reported a very sedentary lifestyle with no exercise. Also,  he said he had cut out sodas but otherwise had a very difficult time consuming a good diet.  At [place] 06/2013: He says that he has been exercising every day for at least 30 minutes sometimes up to 90 minutes. Says he does  " Power Walking".  Says that he sometimes walks outside. Says he also has a treadmill at home that he can use if needed. Says that when he is traveling and is in a hotel-- he turns the heat to 80 and walks back and forth across the hotel room!!  Says he likes to keep up with the news so  he turns on the news on TV and  walks back and forth while he listens to that--says it keep shim entertained!!   At [place] 01/2014--he says that he is no longer traveling--is home at nights now---but is using his treadmill about 3 days / week.  At [place] 01/2015 he says that he is still at home at nights. Says that he uses his treadmill sometimes just 2 or 3 times per week but sometimes 5 times per week.  Has continued to lose weight. Weight today down to 274.  At [place] 08/07/2015--  Reviewed that since LOV with me, he has had visits here with Dr. Dennard Schaumann regarding back pain and neck pain.  Today he reports that he has not been using the treadmill because he is afraid to is afraid to his back pain will recur. Says he has been active with having to mow the grass and weed eat. Reviewed that his weight is up today up to 294.----weight up 20 pounds in 7 months Says that he feels like he is eating  the same as he did in the past and does not feel like he is eating more or eating different foods. O/W, No complaints or concerns today.  Hypertension: He is taking medication as directed. No adverse effects. No lightheadedness. No lower extremity edema. He checks his blood pressure at home and gets good readings.  Hyperlipidemia: He is taking Lipitor 80 mg as directed. He has no myalgias and no other adverse effects.  Says he is smoking about the same amount as at the last visit. Says this is about  1 - 1 1/2 ppd.  When he does any physical exertion he reports that he has no chest pressure tightness or heaviness he has no increased shortness of breath or dyspnea on exertion .   Past Medical History  Diagnosis Date  . Hyperlipidemia   . Hypertension   . Obesity   . Family history of premature coronary artery disease 10/09/2012  . DDD (degenerative disc disease), cervical   . DDD (degenerative disc disease), lumbar      Home Meds: Outpatient Prescriptions Prior to Visit  Medication Sig Dispense Refill  . diclofenac (VOLTAREN) 75 MG EC tablet Take 1 tablet (75 mg total) by mouth 2 (two)  times daily. 60 tablet 2  . Multiple Vitamin (MULTIVITAMIN) tablet Take 1 tablet by mouth daily.    Marland Kitchen amoxicillin (AMOXIL) 500 MG capsule Take 500 mg by mouth 2 (two) times daily.    Marland Kitchen atorvastatin (LIPITOR) 80 MG tablet Take 1 tablet (80 mg total) by mouth at bedtime. 30 tablet 5  . azithromycin (ZITHROMAX) 250 MG tablet 2 tabs poqday1, 1 tab poqday 2-5 6 tablet 0  . lisinopril-hydrochlorothiazide (PRINZIDE,ZESTORETIC) 20-25 MG tablet TAKE ONE TABLET ONCE DAILY 90 tablet 1   No facility-administered medications prior to visit.       Allergies: No Known Allergies  Social History   Social History  . Marital Status: Legally Separated    Spouse Name: N/A  . Number of Children: N/A  . Years of Education: N/A   Occupational History  . Not on file.   Social History Main Topics  . Smoking  status: Current Every Day Smoker -- 1.00 packs/day    Types: Cigarettes  . Smokeless tobacco: Never Used  . Alcohol Use: No  . Drug Use: No  . Sexual Activity: Not on file   Other Topics Concern  . 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    Family History  Problem Relation Age of Onset  . Heart disease Father 18    stent     Review of Systems:  See HPI for pertinent ROS. All other ROS negative.    Physical Exam: Blood pressure 112/68, temperature 98.5 F (36.9 C), temperature source Oral, height 5\' 11"  (1.803 m), weight 294 lb (133.358 kg)., Body mass index is 41.02 kg/(m^2). General: Obese white male .Appears in no acute distress. Neck: Supple. No thyromegaly. No lymphadenopathy. No carotid bruit. Lungs: Clear bilaterally to auscultation without wheezes, rales, or rhonchi. Breathing is unlabored. Heart: RRR with S1 S2. No murmurs, rubs, or gallops. Abdomen: Soft, non-tender, non-distended with normoactive bowel sounds. No hepatomegaly. No rebound/guarding. No obvious abdominal masses. Musculoskeletal:  Strength and tone normal for age. Extremities/Skin: Warm and dry.  No edema.  Neuro: Alert and oriented X 3. Moves all extremities spontaneously. Gait is normal. CNII-XII grossly in tact. Psych:  Responds to questions appropriately with a normal affect.      ASSESSMENT AND PLAN:  50 y.o. year old male with  1. Family history of premature coronary artery disease Father with history of CAD. Father had stents and first diagnosed CAD in his 39s.  2. Hyperlipidemia At goal last check. Recheck now.   - atorvastatin (LIPITOR) 80 MG tablet; Take 1 tablet (80 mg total) by mouth at bedtime.  Dispense: 30 tablet; Refill: 5  3. Hypertension At goal. Check lab to monitor now. Continue current medication. Followup office visit and lab in 6 months. - lisinopril-hydrochlorothiazide (PRINZIDE,ZESTORETIC) 20-25 MG  per tablet; TAKE ONE TABLET BY MOUTH EVERY DAY  Dispense: 30 tablet; Refill: 5  4. Obesity CONGRATS!!--Good Job with the weight loss and exercise!! At office visit 10/2012 weight was 317.  06/2013 is 303. !! 01/2014--305---says had vacation in October, then Thanksgiving--try to keep this controlled!!! 07/2014---285---great!! 01/2015---274 !! 08/2015---weight up to 294-----see HPI---will check TSH to eval for hypothyroid as cause of weight gain---if TSH normal, needs to be very careful with diet and needs to walk for exercise--discussed that simple walking should not cause back pain.   Weight gain - TSH   5. Smoker unmotivated to quit He is not  interested in using Chantix. I discussed possibly using Wellbutrin which could possibly help curb his cravings for her nicotine and smoking as well as curb his appetite for food. He says he will consider this but is not interested in pursuing this today. Discussed with him trying to at least decrease the amount of cigarettes he is smoking per day even if he cannot quit. We Had the same conversation at last office visit and had the same conversation again today. He has not decreased his smoking at all.  Have discussed with him that especially given his father's family history he really needs to do everything he can to decrease his risk for developing cardiac disease. He really needs to improve his diet and exercise and weight loss and off it he really needs to stop smoking to help reduce this risk. Good job on the exercise and weight loss!!   Prostate cancer screening Discussed with him that he is now age 55 so should have prostate cancer screening so will do PSA with lab. He is agreeable. - PSA--checked 01/16/2015  Colorectal Cancer Screening At office visit 01/16/15 he is now age 32. I discussed colonoscopy but he defers right now and says that he will think about it. Then discussed Hemoccults but still he says that he will think about this but does not  want to have the Hemoccult cards today. At [place] 08/07/2015--- Discussed screening colonoscopy again but he refuses/defers. Also reviewed that he never did the Hemoccult cards in the past. Today he is agreeable to do the cologuard and this is ordered today.  Immunizations:  discussed that he needs to have a pneumonia vaccine given his smoking. He defers. Discussed that he would only need this 1 pneumonia vaccine and he would not need another one until age 61. He says that he will research this and think about it and let me know at  the next visit. Discussed again at follow-up visit 08/2015 that he refuses /  defers pneumonia vaccine  Also discussed tetanus vaccine. Says he has not had one in greater than 10 years. He defers this today.  Discussed vaccines at length 06/2013 and again 01/2014---still defers.     Fasting labs and regular office visit in 6 months or sooner if he needs [place]. He states that he prefers to have his labs done while he is here at the visit instead of coming here for 2 separate trips. To schedule his next appointment early morning and come fasting to the appointment so we can do labs while he is here at the visit.    [address] [place], [place], [facility] 08/07/2015 1:44 PM

## 2015-08-08 LAB — HEPATIC FUNCTION PANEL
ALT: 23 U/L (ref 9–46)
AST: 18 U/L (ref 10–35)
Albumin: 4.3 g/dL (ref 3.6–5.1)
Alkaline Phosphatase: 106 U/L (ref 40–115)
BILIRUBIN DIRECT: 0.1 mg/dL (ref <=0.2)
BILIRUBIN TOTAL: 0.4 mg/dL (ref 0.2–1.2)
Indirect Bilirubin: 0.3 mg/dL (ref 0.2–1.2)
Total Protein: 6.4 g/dL (ref 6.1–8.1)

## 2015-08-08 LAB — LIPID PANEL
CHOL/HDL RATIO: 4.6 ratio (ref <=5.0)
Cholesterol: 166 mg/dL (ref 125–200)
HDL: 36 mg/dL — AB (ref >=40)
LDL CALC: 98 mg/dL (ref <130)
Triglycerides: 158 mg/dL — ABNORMAL HIGH (ref <150)
VLDL: 32 mg/dL — AB (ref <30)

## 2015-08-08 LAB — TSH: TSH: 1.44 m[IU]/L (ref 0.40–4.50)

## 2015-08-11 ENCOUNTER — Ambulatory Visit: Payer: Managed Care, Other (non HMO) | Admitting: Family Medicine

## 2015-08-12 ENCOUNTER — Ambulatory Visit: Payer: Managed Care, Other (non HMO) | Admitting: Family Medicine

## 2015-09-01 ENCOUNTER — Telehealth: Payer: Self-pay | Admitting: Physician Assistant

## 2015-09-01 DIAGNOSIS — M5441 Lumbago with sciatica, right side: Secondary | ICD-10-CM

## 2015-09-01 MED ORDER — DICLOFENAC SODIUM 75 MG PO TBEC: 75.0000 mg | DELAYED_RELEASE_TABLET | Freq: Two times a day (BID) | ORAL | 1 refills | Status: DC

## 2015-09-01 NOTE — Telephone Encounter (Signed)
Medication called/sent to requested pharmacy  

## 2015-09-01 NOTE — Telephone Encounter (Signed)
Patients wife kim calling to see if Arling can get a three month supply of diclofenac if possible  [facility]  If any questions call kim at [phone]

## 2015-09-09 ENCOUNTER — Telehealth: Payer: Self-pay | Admitting: Physician Assistant

## 2015-09-09 NOTE — Telephone Encounter (Signed)
[phone] Patient is calling to say that when mary beth prescribed his lisinopril and atorvastatin it was supposed to be a 6 month supply, and he got three month supply

## 2015-09-09 NOTE — Telephone Encounter (Signed)
Pt called was given 90 days each plus 1 refill.  If pharmacy does not have that have them contact us.

## 2015-12-29 ENCOUNTER — Other Ambulatory Visit: Payer: Self-pay | Admitting: Physician Assistant

## 2015-12-29 NOTE — Telephone Encounter (Signed)
Rx filled per protocol  

## 2016-01-18 ENCOUNTER — Encounter: Payer: Self-pay | Admitting: Physician Assistant

## 2016-01-19 ENCOUNTER — Telehealth: Payer: Self-pay

## 2016-01-19 DIAGNOSIS — E785 Hyperlipidemia, unspecified: Secondary | ICD-10-CM

## 2016-01-19 MED ORDER — LISINOPRIL-HYDROCHLOROTHIAZIDE 20-25 MG PO TABS: ORAL_TABLET | ORAL | 0 refills | Status: DC

## 2016-01-19 MED ORDER — ATORVASTATIN CALCIUM 80 MG PO TABS: 80.0000 mg | ORAL_TABLET | Freq: Every day | ORAL | 0 refills | Status: DC

## 2016-01-19 NOTE — Telephone Encounter (Signed)
Rx refilled patient made aware via Mychart

## 2016-01-28 ENCOUNTER — Ambulatory Visit: Payer: Managed Care, Other (non HMO) | Admitting: Physician Assistant

## 2016-03-01 ENCOUNTER — Telehealth: Payer: Self-pay | Admitting: Family Medicine

## 2016-03-01 NOTE — Telephone Encounter (Signed)
LMOVM with wife to schedule appt.

## 2016-03-01 NOTE — Telephone Encounter (Signed)
Pt called stating that he is still having testicle discomfort and wants to know if he can have medication called into pharmacy or if he will need an appointment.

## 2016-03-01 NOTE — Telephone Encounter (Signed)
ntbs to figure out what it is.

## 2016-04-23 IMAGING — CT CT ABD-PELV W/ CM
2 of 4 series · 15 of 46 positions shown, 17 images · IV contrast (Omnipaque 300)
Comparison: None.

CLINICAL DATA: Left upper quadrant pain. Palpable mass within the
left upper quadrant.

EXAM:
CT ABDOMEN AND PELVIS WITH CONTRAST
TECHNIQUE: Multidetector CT imaging of the abdomen and pelvis was performed
using the standard protocol following bolus administration of
intravenous contrast.
CONTRAST:  100mL OMNIPAQUE IOHEXOL 300 MG/ML  SOLN

[Series 2: abd_pel_with 5.0 b40f · axial · 0.88mm/px · z∈[-562,-88]mm · 12 of 107 slices shown, 14 images]
[im 6/107  soft-tissue]
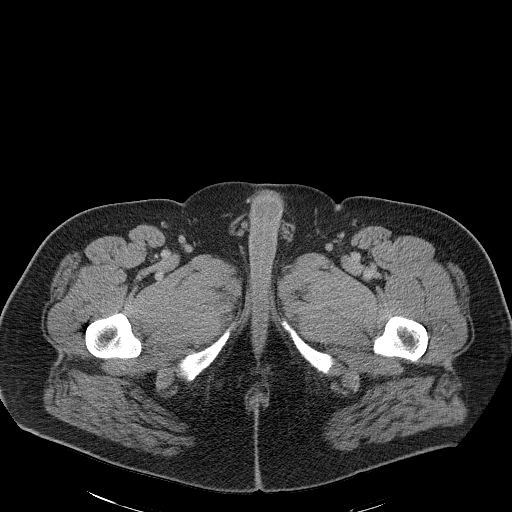
[im 6/107  bone]
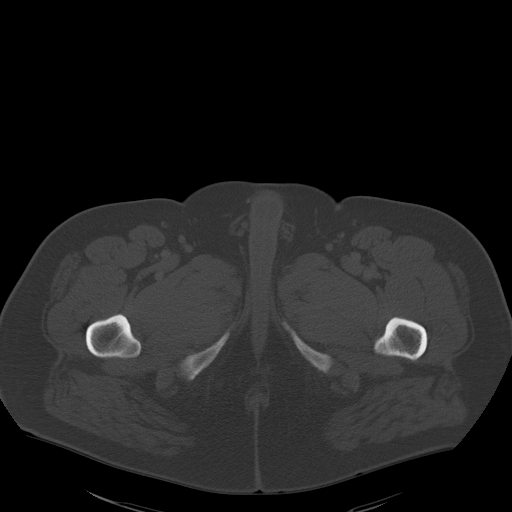
[im 16/107  soft-tissue]
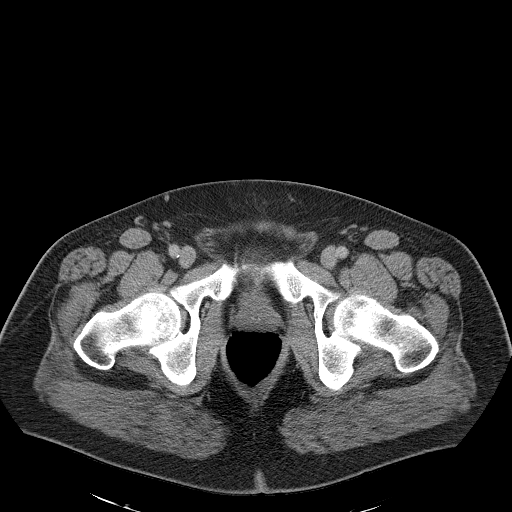
[im 26/107  soft-tissue]
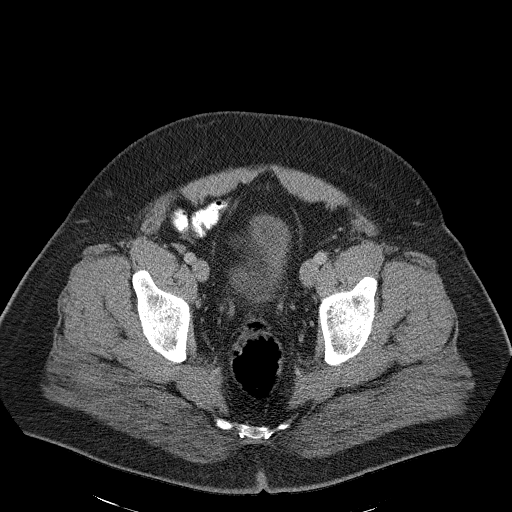
[im 31/107  soft-tissue]
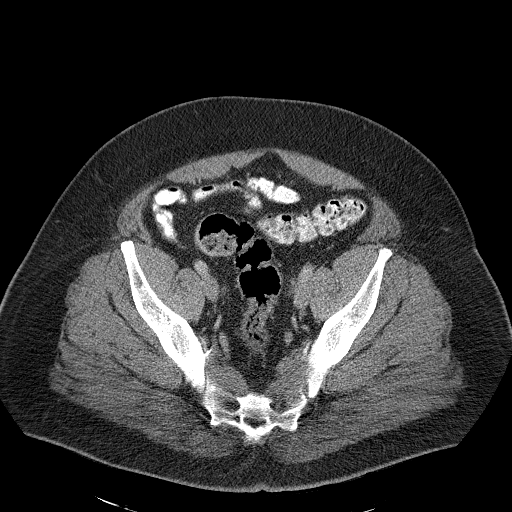
[im 41/107  soft-tissue]
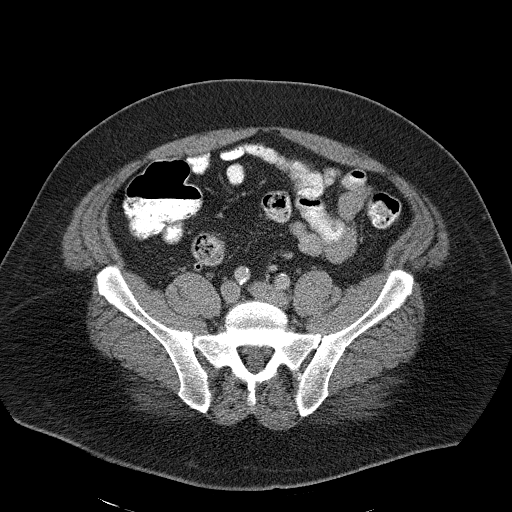
[im 51/107  soft-tissue]
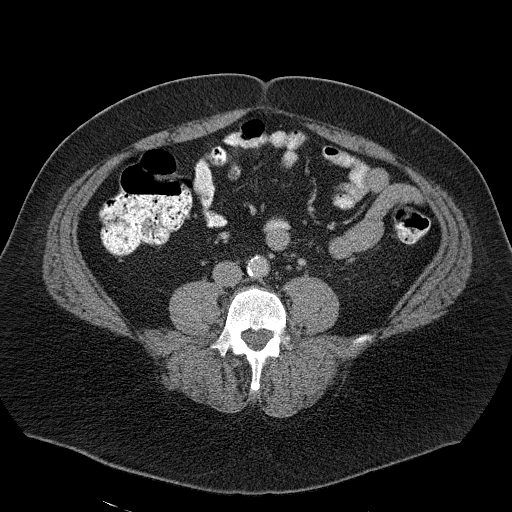
[im 56/107  soft-tissue]
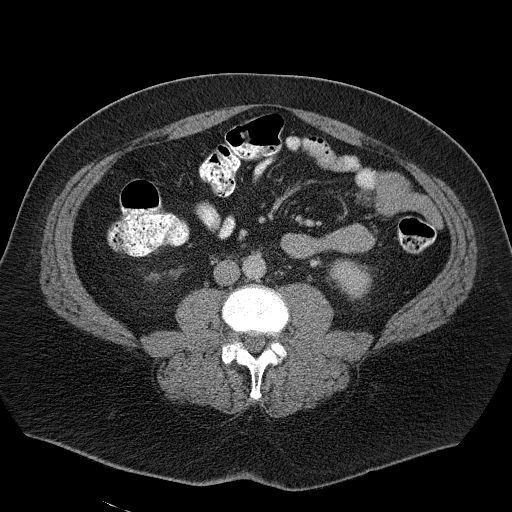
[im 66/107  soft-tissue]
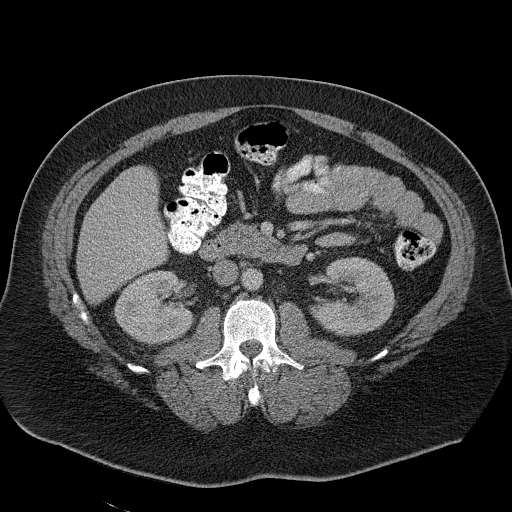
[im 76/107  soft-tissue]
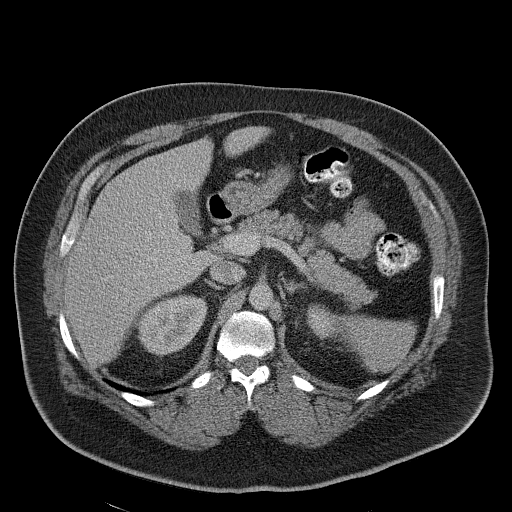
[im 76/107  bone]
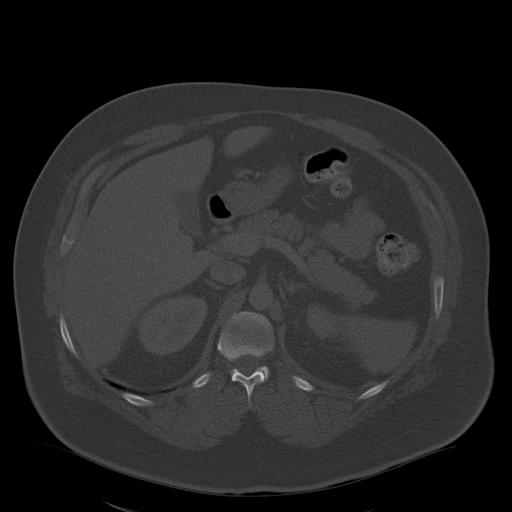
[im 81/107  soft-tissue]
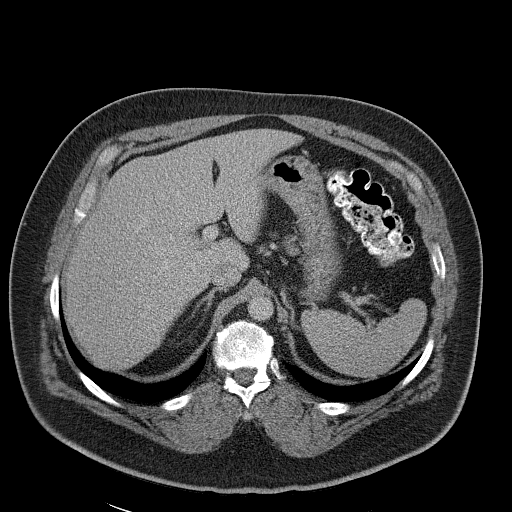
[im 91/107  soft-tissue]
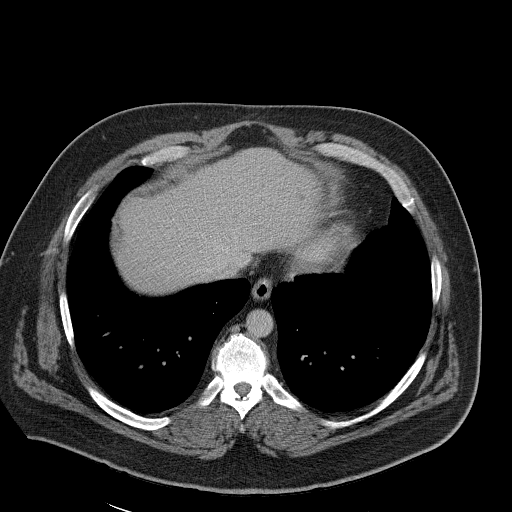
[im 101/107  soft-tissue]
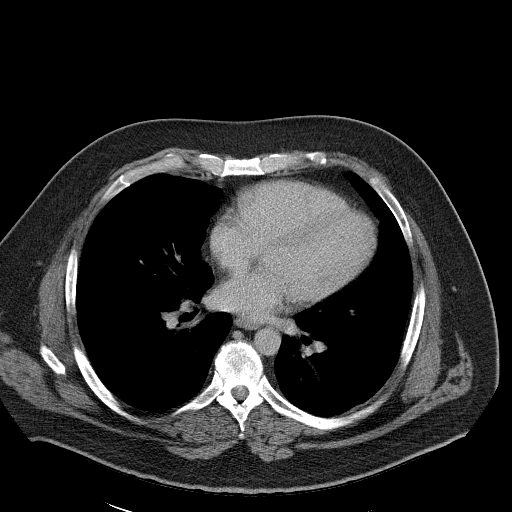

[Series 4: abd_pel_with 3.0 spo · coronal · 0.80mm/px · 3 of 113 slices shown]
[im 38/113  soft-tissue]
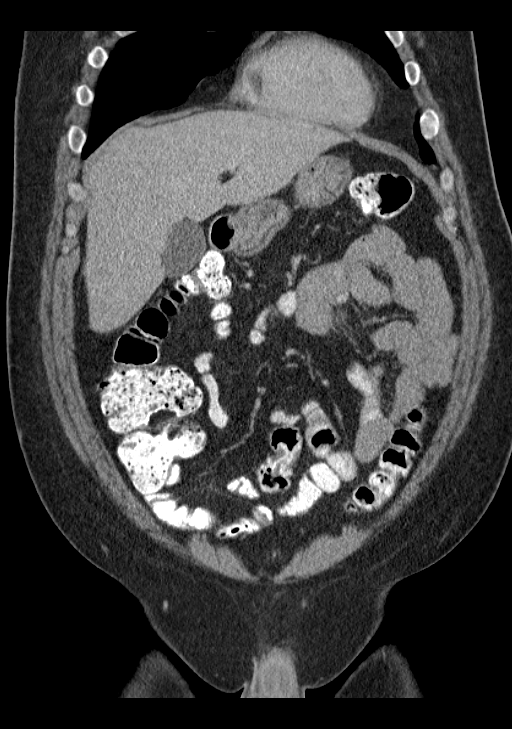
[im 50/113  soft-tissue]
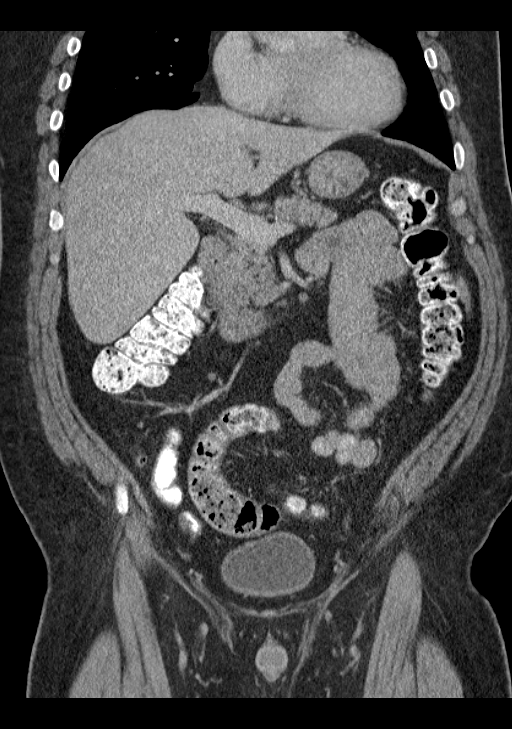
[im 63/113  soft-tissue]
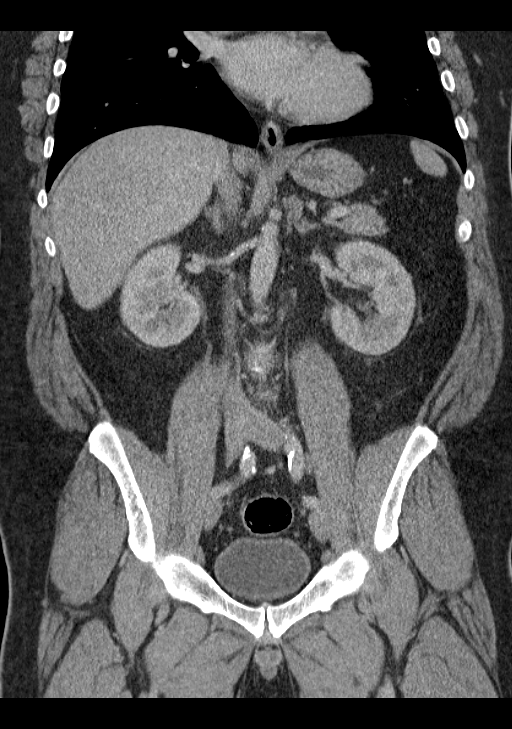

[15 of 46 positions shown; findings below may reference images not displayed]

FINDINGS: Visualization of the lower thorax demonstrate aunt minimal dependent
atelectasis left lower lobe. Normal heart size. No pericardial
effusion.

There is an 8 mm low-attenuation lesion high within the left hepatic
lobe (image 16; series 2), too small to characterize. The liver is
normal in size and contour. Portal vein is patent. Gallbladder is
unremarkable. The spleen, pancreas and right adrenal gland are
unremarkable. There is an indeterminate 1.3 cm left adrenal nodule
(image 29; series 2).

The kidneys enhance symmetrically with contrast. There is an
indeterminate 1.2 cm low-attenuation lesion within the inferior pole
of the left kidney (image 50; series 2). Nonspecific perinephric fat
stranding. Normal caliber abdominal aorta. No retroperitoneal
lymphadenopathy. Calcified atherosclerotic plaque involving the
lower abdominal aorta. Urinary bladder is grossly unremarkable.
Prostate unremarkable.

Oral contrast material is demonstrated throughout the small and
large bowel. The appendix is normal in caliber. No abnormal bowel
wall thickening. No evidence for bowel obstruction. No free fluid or
free intraperitoneal air.

Lower lumbar spine degenerative change. No aggressive appearing
osseous lesions. Probable bone island within the right tenth rib at
the costovertebral junction.
IMPRESSION: No definite cause for acute abdominal pain identified.

Nonspecific perinephric fat stranding. Consider correlation with
urinalysis to exclude infection.

Indeterminate 1.2 cm low-attenuation lesion inferior pole left
kidney. Further evaluation with pre and post contrast MRI should be
considered. Pre and post contrast CT could alternatively be
performed, but would likely be of decreased accuracy given lesion
size.

Indeterminate left adrenal nodule. Consider followup CT or MRI
imaging in 12 months to ensure stability and for definitive
characterization.

## 2016-05-24 ENCOUNTER — Ambulatory Visit (INDEPENDENT_AMBULATORY_CARE_PROVIDER_SITE_OTHER): Payer: Managed Care, Other (non HMO) | Admitting: Family Medicine

## 2016-05-24 ENCOUNTER — Encounter: Payer: Self-pay | Admitting: Family Medicine

## 2016-05-24 VITALS — BP 130/80 | HR 92 | Temp 97.9°F | Resp 20 | Ht 71.0 in | Wt 303.0 lb

## 2016-05-24 DIAGNOSIS — K219 Gastro-esophageal reflux disease without esophagitis: Secondary | ICD-10-CM

## 2016-05-24 MED ORDER — PANTOPRAZOLE SODIUM 40 MG PO TBEC: 40.0000 mg | DELAYED_RELEASE_TABLET | Freq: Two times a day (BID) | ORAL | 3 refills | Status: DC

## 2016-05-24 MED ORDER — PANTOPRAZOLE SODIUM 40 MG PO TBEC: 40.0000 mg | DELAYED_RELEASE_TABLET | Freq: Two times a day (BID) | ORAL | 0 refills | Status: DC

## 2016-05-24 NOTE — Progress Notes (Signed)
Subjective:    Patient ID: Jorge Bowman, male    DOB: 1965-04-17, 51 y.o.   MRN: [id number]  Back Pain   Gastroesophageal Reflux   Patient reports reflux in the center of his chest substernal area the last few weeks. He reports indigestion. Frequently he will wake up at night with a "wet burps" with a warm ascitic fluid in his mouth. He denies any food sticking in his throat but it sometimes he does feel dysphasia. He denies any hemoptysis. He denies any melena or hematochezia. He tried over-the-counter Prilosec 20 mg a day for a week with no benefit he is not taking any NSAIDs. He denies any chest pain or shortness of breath with exertion. Sometimes he feels short of breath when he wakes up with this fluid in his throat almost like a laryngospasm due to the reflux  Past Medical History:  Diagnosis Date  . DDD (degenerative disc disease), cervical   . DDD (degenerative disc disease), lumbar   . Family history of premature coronary artery disease 10/09/2012  . Hyperlipidemia   . Hypertension   . Obesity    No past surgical history on file. Current Outpatient Prescriptions on File Prior to Visit  Medication Sig Dispense Refill  . atorvastatin (LIPITOR) 80 MG tablet Take 1 tablet (80 mg total) by mouth at bedtime. 90 tablet 0  . diclofenac (VOLTAREN) 75 MG EC tablet Take 1 tablet (75 mg total) by mouth 2 (two) times daily. (Patient taking differently: Take 75 mg by mouth 2 (two) times daily as needed. ) 180 tablet 1  . lisinopril-hydrochlorothiazide (PRINZIDE,ZESTORETIC) 20-25 MG tablet Take 1 tablet by mouth daily. 90 tablet 1  . Multiple Vitamin (MULTIVITAMIN) tablet Take 1 tablet by mouth daily.     No current facility-administered medications on file prior to visit.    No Known Allergies Social History   Social History  . Marital status: Legally Separated    Spouse name: N/A  . Number of children: N/A  . Years of education: N/A   Occupational History  . Not on file.   Social  History Main Topics  . Smoking status: Current Every Day Smoker    Packs/day: 1.00    Types: Cigarettes  . Smokeless tobacco: Never Used  . Alcohol use No  . Drug use: No  . Sexual activity: Not on file   Other Topics Concern  . 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      Review of Systems  Musculoskeletal: Positive for back pain.  All other systems reviewed and are negative.      Objective:   Physical Exam  Constitutional: He appears well-developed and well-nourished. No distress.  HENT:  Right Ear: Tympanic membrane and ear canal normal.  Left Ear: Tympanic membrane and ear canal normal.  Nose: Mucosal edema and rhinorrhea present.  Neck: Normal range of motion. Neck supple. No JVD present. No thyromegaly present.  Cardiovascular: Normal rate, regular rhythm and normal heart sounds.   Pulmonary/Chest: Effort normal and breath sounds normal. No stridor. No respiratory distress. He has no wheezes. He has no rales. He exhibits no tenderness.  Abdominal: Soft. Bowel sounds are normal. He exhibits no distension and no mass. There is no tenderness. There is no rebound and no guarding.  Lymphadenopathy:    He has no cervical adenopathy.  Skin: He is not diaphoretic.  Vitals reviewed.  Assessment & Plan:  Gastroesophageal reflux disease, esophagitis presence not specified - Plan: pantoprazole (PROTONIX) 40 MG tablet, DISCONTINUED: pantoprazole (PROTONIX) 40 MG tablet  Discontinue Prilosec and try protonix 40 mg by mouth twice a day and recheck in 2 weeks. If no better, consult GI for an EGD

## 2016-06-01 ENCOUNTER — Ambulatory Visit (INDEPENDENT_AMBULATORY_CARE_PROVIDER_SITE_OTHER): Payer: Managed Care, Other (non HMO) | Admitting: Family Medicine

## 2016-06-01 ENCOUNTER — Encounter: Payer: Self-pay | Admitting: Family Medicine

## 2016-06-01 VITALS — BP 120/70 | HR 96 | Temp 98.2°F | Resp 20 | Ht 71.0 in | Wt 306.0 lb

## 2016-06-01 DIAGNOSIS — B9689 Other specified bacterial agents as the cause of diseases classified elsewhere: Secondary | ICD-10-CM | POA: Diagnosis not present

## 2016-06-01 DIAGNOSIS — J019 Acute sinusitis, unspecified: Secondary | ICD-10-CM | POA: Diagnosis not present

## 2016-06-01 MED ORDER — DOXYCYCLINE HYCLATE 100 MG PO TABS: 100.0000 mg | ORAL_TABLET | Freq: Two times a day (BID) | ORAL | 0 refills | Status: DC

## 2016-06-01 NOTE — Progress Notes (Signed)
Subjective:    Patient ID: Jorge Bowman, male    DOB: 11-Jan-1966, 51 y.o.   MRN: [id number]  HPI  Over the last week, the patient has developed flulike symptoms including body aches, fever to 101, feeling weak and tired. He has not been taking his Claritin. As a result he is extremely congested. He denies any rhinorrhea but he does report head congestion. He denies any cough or sore throat. He denies any nausea vomiting or diarrhea. He denies any rash. He denies any recent tick bite. He has been experiencing some mild dysuria over the last 24 hours. She resists not drinking enough water. He denies any hematuria or pyuria. He does have some sinus pressure and pain Past Medical History:  Diagnosis Date  . DDD (degenerative disc disease), cervical   . DDD (degenerative disc disease), lumbar   . Family history of premature coronary artery disease 10/09/2012  . Hyperlipidemia   . Hypertension   . Obesity    No past surgical history on file. Current Outpatient Prescriptions on File Prior to Visit  Medication Sig Dispense Refill  . atorvastatin (LIPITOR) 80 MG tablet Take 1 tablet (80 mg total) by mouth at bedtime. 90 tablet 0  . diclofenac (VOLTAREN) 75 MG EC tablet Take 1 tablet (75 mg total) by mouth 2 (two) times daily. (Patient taking differently: Take 75 mg by mouth 2 (two) times daily as needed. ) 180 tablet 1  . lisinopril-hydrochlorothiazide (PRINZIDE,ZESTORETIC) 20-25 MG tablet Take 1 tablet by mouth daily. 90 tablet 1  . Multiple Vitamin (MULTIVITAMIN) tablet Take 1 tablet by mouth daily.    . pantoprazole (PROTONIX) 40 MG tablet Take 1 tablet (40 mg total) by mouth 2 (two) times daily. 60 tablet 0   No current facility-administered medications on file prior to visit.    No Known Allergies Social History   Social History  . Marital status: Legally Separated    Spouse name: N/A  . Number of children: N/A  . Years of education: N/A   Occupational History  . Not on file.    Social History Main Topics  . Smoking status: Current Every Day Smoker    Packs/day: 1.00    Types: Cigarettes  . Smokeless tobacco: Never Used  . Alcohol use No  . Drug use: No  . Sexual activity: Not on file   Other Topics Concern  . 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     Review of Systems  All other systems reviewed and are negative.      Objective:   Physical Exam  HENT:  Right Ear: External ear normal.  Left Ear: External ear normal.  Nose: Mucosal edema and rhinorrhea present. Right sinus exhibits no maxillary sinus tenderness and no frontal sinus tenderness. Left sinus exhibits no maxillary sinus tenderness and no frontal sinus tenderness.  Mouth/Throat: Oropharynx is clear and moist. No oropharyngeal exudate.  Eyes: Conjunctivae are normal.  Neck: Neck supple.  Cardiovascular: Normal rate, regular rhythm and normal heart sounds.   Pulmonary/Chest: Effort normal and breath sounds normal. No respiratory distress. He has no wheezes. He has no rales.  Abdominal: Soft. Bowel sounds are normal. He exhibits no distension. There is no tenderness. There is no rebound and no guarding.  Lymphadenopathy:    He has no cervical adenopathy.  Vitals reviewed.         Assessment & Plan:  Acute bacterial rhinosinusitis - Plan: doxycycline (VIBRA-TABS) 100 MG tablet  I suspect that the patient has sinusitis. Begin doxycycline 100 mg by mouth twice a day for 10 days to treat possible sinusitis and to also cover his mild dysuria.

## 2016-07-02 ENCOUNTER — Encounter: Payer: Self-pay | Admitting: Family Medicine

## 2016-07-02 ENCOUNTER — Ambulatory Visit (INDEPENDENT_AMBULATORY_CARE_PROVIDER_SITE_OTHER): Payer: Managed Care, Other (non HMO) | Admitting: Family Medicine

## 2016-07-02 VITALS — BP 112/68 | HR 68 | Temp 97.8°F | Resp 20 | Ht 71.0 in | Wt 299.0 lb

## 2016-07-02 DIAGNOSIS — K219 Gastro-esophageal reflux disease without esophagitis: Secondary | ICD-10-CM

## 2016-07-02 DIAGNOSIS — E785 Hyperlipidemia, unspecified: Secondary | ICD-10-CM | POA: Diagnosis not present

## 2016-07-02 DIAGNOSIS — F458 Other somatoform disorders: Secondary | ICD-10-CM | POA: Diagnosis not present

## 2016-07-02 DIAGNOSIS — R0989 Other specified symptoms and signs involving the circulatory and respiratory systems: Secondary | ICD-10-CM

## 2016-07-02 NOTE — Progress Notes (Signed)
Subjective:    Patient ID: Jorge Bowman, male    DOB: 10-21-1965, 51 y.o.   MRN: [id number]  Gastroesophageal Reflux   Back Pain    05/24/16 Patient reports reflux in the center of his chest substernal area the last few weeks. He reports indigestion. Frequently he will wake up at night with a "wet burps" with a warm ascitic fluid in his mouth. He denies any food sticking in his throat but it sometimes he does feel dysphasia. He denies any hemoptysis. He denies any melena or hematochezia. He tried over-the-counter Prilosec 20 mg a day for a week with no benefit he is not taking any NSAIDs. He denies any chest pain or shortness of breath with exertion. Sometimes he feels short of breath when he wakes up with this fluid in his throat almost like a laryngospasm due to the reflux.  At that time, my plan was: Discontinue Prilosec and try protonix 40 mg by mouth twice a day and recheck in 2 weeks. If no better, consult GI for an EGD  07/02/16 Patient states that the reflux symptoms got better. However he is only been taking pantoprazole twice a day as needed. He is not taking it on a scheduled basis. Over the last 2-3 weeks, he has developed a globus sensation in the left side of his throat. Occasionally has a difficult time swallowing pills or swallowing food. Nothing has become lodged or stuck in his throat. Seems to be gradually worsening. He denies any hemoptysis. He denies any hematemesis. He denies any stridor. He denies any angioedema or tongue swelling as he is on lisinopril. He has been battling allergies and does report postnasal drip.  Past Medical History:  Diagnosis Date  . DDD (degenerative disc disease), cervical   . DDD (degenerative disc disease), lumbar   . Family history of premature coronary artery disease 10/09/2012  . Hyperlipidemia   . Hypertension   . Obesity    No past surgical history on file. Current Outpatient Prescriptions on File Prior to Visit  Medication Sig Dispense  Refill  . atorvastatin (LIPITOR) 80 MG tablet Take 1 tablet (80 mg total) by mouth at bedtime. 90 tablet 0  . diclofenac (VOLTAREN) 75 MG EC tablet Take 1 tablet (75 mg total) by mouth 2 (two) times daily. (Patient taking differently: Take 75 mg by mouth 2 (two) times daily as needed. ) 180 tablet 1  . lisinopril-hydrochlorothiazide (PRINZIDE,ZESTORETIC) 20-25 MG tablet Take 1 tablet by mouth daily. 90 tablet 1  . Multiple Vitamin (MULTIVITAMIN) tablet Take 1 tablet by mouth daily.    . pantoprazole (PROTONIX) 40 MG tablet Take 1 tablet (40 mg total) by mouth 2 (two) times daily. (Patient taking differently: Take 40 mg by mouth 2 (two) times daily as needed. ) 60 tablet 0   No current facility-administered medications on file prior to visit.    No Known Allergies Social History   Social History  . Marital status: Legally Separated    Spouse name: N/A  . Number of children: N/A  . Years of education: N/A   Occupational History  . Not on file.   Social History Main Topics  . Smoking status: Current Every Day Smoker    Packs/day: 1.00    Types: Cigarettes  . Smokeless tobacco: Never Used  . Alcohol use No  . Drug use: No  . Sexual activity: Not on file   Other Topics Concern  . 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      Review of Systems  Musculoskeletal: Positive for back pain.  All other systems reviewed and are negative.      Objective:   Physical Exam  Constitutional: He appears well-developed and well-nourished. No distress.  HENT:  Right Ear: Tympanic membrane and ear canal normal.  Left Ear: Tympanic membrane and ear canal normal.  Nose: Mucosal edema and rhinorrhea present.  Neck: Normal range of motion. Neck supple. No JVD present. No thyromegaly present.  Cardiovascular: Normal rate, regular rhythm and normal heart sounds.   Pulmonary/Chest: Effort normal and breath sounds normal. No  stridor. No respiratory distress. He has no wheezes. He has no rales. He exhibits no tenderness.  Abdominal: Soft. Bowel sounds are normal. He exhibits no distension and no mass. There is no tenderness. There is no rebound and no guarding.  Lymphadenopathy:    He has no cervical adenopathy.  Skin: He is not diaphoretic.  Vitals reviewed.         Assessment & Plan:  Globus sensation  Gastroesophageal reflux disease, esophagitis presence not specified  Hyperlipidemia, unspecified hyperlipidemia type - Plan: COMPLETE METABOLIC PANEL WITH GFR, Lipid panel  His exam is unremarkable. Given his recent symptoms, I believe the globus sensation secondary to reflux. I have asked him to take pantoprazole twice a day every day regardless and give it 2-3 weeks. If symptoms are not improving at that time, I will request that the patient have an EGD performed By gastroenterology to visualize his upper esophagus and to rule out obstructive lesion. Return fasting at his convenience to check a fasting lipid panel to monitor the management of his cholesterol.

## 2016-07-05 ENCOUNTER — Other Ambulatory Visit: Payer: Managed Care, Other (non HMO)

## 2016-07-05 ENCOUNTER — Encounter: Payer: Self-pay | Admitting: Family Medicine

## 2016-07-05 DIAGNOSIS — K219 Gastro-esophageal reflux disease without esophagitis: Secondary | ICD-10-CM

## 2016-07-06 ENCOUNTER — Encounter: Payer: Self-pay | Admitting: Family Medicine

## 2016-07-06 LAB — COMPLETE METABOLIC PANEL WITH GFR
ALBUMIN: 3.8 g/dL (ref 3.6–5.1)
ALK PHOS: 96 U/L (ref 40–115)
ALT: 12 U/L (ref 9–46)
AST: 12 U/L (ref 10–35)
BUN: 21 mg/dL (ref 7–25)
CHLORIDE: 104 mmol/L (ref 98–110)
CO2: 26 mmol/L (ref 20–31)
Calcium: 8.6 mg/dL (ref 8.6–10.3)
Creat: 0.97 mg/dL (ref 0.70–1.33)
GFR, Est African American: >89 mL/min (ref >=60)
GLUCOSE: 93 mg/dL (ref 70–99)
POTASSIUM: 4.9 mmol/L (ref 3.5–5.3)
SODIUM: 137 mmol/L (ref 135–146)
Total Bilirubin: 0.4 mg/dL (ref 0.2–1.2)
Total Protein: 6.3 g/dL (ref 6.1–8.1)

## 2016-07-06 LAB — LIPID PANEL
CHOL/HDL RATIO: 3.4 ratio (ref <5.0)
Cholesterol: 120 mg/dL (ref <200)
HDL: 35 mg/dL — AB (ref >40)
LDL CALC: 58 mg/dL (ref <100)
Triglycerides: 135 mg/dL (ref <150)
VLDL: 27 mg/dL (ref <30)

## 2016-07-06 MED ORDER — PANTOPRAZOLE SODIUM 40 MG PO TBEC: 40.0000 mg | DELAYED_RELEASE_TABLET | Freq: Two times a day (BID) | ORAL | 5 refills | Status: DC

## 2016-07-23 ENCOUNTER — Other Ambulatory Visit: Payer: Self-pay | Admitting: Physician Assistant

## 2016-07-23 DIAGNOSIS — E785 Hyperlipidemia, unspecified: Secondary | ICD-10-CM

## 2016-07-23 NOTE — Telephone Encounter (Signed)
Refill appropriate 

## 2016-08-06 ENCOUNTER — Other Ambulatory Visit: Payer: Self-pay | Admitting: Physician Assistant

## 2016-08-23 ENCOUNTER — Ambulatory Visit (INDEPENDENT_AMBULATORY_CARE_PROVIDER_SITE_OTHER): Payer: Managed Care, Other (non HMO) | Admitting: Family Medicine

## 2016-08-23 ENCOUNTER — Encounter: Payer: Self-pay | Admitting: Family Medicine

## 2016-08-23 VITALS — BP 120/74 | HR 74 | Temp 98.8°F | Resp 16 | Ht 71.0 in | Wt 295.0 lb

## 2016-08-23 DIAGNOSIS — N492 Inflammatory disorders of scrotum: Secondary | ICD-10-CM | POA: Diagnosis not present

## 2016-08-23 MED ORDER — CEPHALEXIN 500 MG PO CAPS: 500.0000 mg | ORAL_CAPSULE | Freq: Three times a day (TID) | ORAL | 0 refills | Status: DC

## 2016-08-23 MED ORDER — CLOTRIMAZOLE-BETAMETHASONE 1-0.05 % EX CREA: 1.0000 "application " | TOPICAL_CREAM | Freq: Two times a day (BID) | CUTANEOUS | 0 refills | Status: DC

## 2016-08-23 NOTE — Progress Notes (Signed)
Subjective:    Patient ID: Jorge Bowman, male    DOB: [birth year], 51 y.o.   MRN: [id number]  HPI  Patient believes his wife may have had a yeast infection. Recently they had intercourse. The following day, the skin on his scrotum was itching and slightly erythematous. The majority of the day, he had to scratch the skin frequently. This morning he woke up, and the skin is very erythematous, hot, tender to the touch, and swollen over the inferior portion of the scrotum extending up onto the ventral shaft of the penis. He appears to have developed a secondary cellulitis following a topical yeast infection. There is no testicular pain. The testicles are smooth and round and nontender. There is no yeast infection around the head of the penis. The skin is just indurated erythematous and warm and painful with visible swelling Past Medical History:  Diagnosis Date  . DDD (degenerative disc disease), cervical   . DDD (degenerative disc disease), lumbar   . Family history of premature coronary artery disease 10/09/2012  . Hyperlipidemia   . Hypertension   . Obesity    No past surgical history on file. Current Outpatient Prescriptions on File Prior to Visit  Medication Sig Dispense Refill  . atorvastatin (LIPITOR) 80 MG tablet TAKE ONE TABLET DAILY AT BEDTIME 90 tablet 1  . diclofenac (VOLTAREN) 75 MG EC tablet Take 1 tablet (75 mg total) by mouth 2 (two) times daily. (Patient taking differently: Take 75 mg by mouth 2 (two) times daily as needed. ) 180 tablet 1  . lisinopril-hydrochlorothiazide (PRINZIDE,ZESTORETIC) 20-25 MG tablet Take 1 tablet by mouth daily. 90 tablet 1  . Multiple Vitamin (MULTIVITAMIN) tablet Take 1 tablet by mouth daily.    . pantoprazole (PROTONIX) 40 MG tablet Take 1 tablet (40 mg total) by mouth 2 (two) times daily. 60 tablet 5   No current facility-administered medications on file prior to visit.    No Known Allergies Social History   Social History  . Marital status:  Legally Separated    Spouse name: N/A  . Number of children: N/A  . Years of education: N/A   Occupational History  . Not on file.   Social History Main Topics  . Smoking status: Current Every Day Smoker    Packs/day: 1.00    Types: Cigarettes  . Smokeless tobacco: Never Used  . Alcohol use No  . Drug use: No  . Sexual activity: Not on file   Other Topics Concern  . 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     Review of Systems  All other systems reviewed and are negative.      Objective:   Physical Exam  Cardiovascular: Normal rate, regular rhythm and normal heart sounds.   Pulmonary/Chest: Effort normal and breath sounds normal.  Genitourinary:    Right testis shows no mass, no swelling and no tenderness. Left testis shows no mass, no swelling and no tenderness. Penile erythema present. No discharge found.  Skin: Skin is warm. Rash noted. There is erythema.  Vitals reviewed.         Assessment & Plan:  Cellulitis, scrotum - Plan: cephALEXin (KEFLEX) 500 MG capsule, clotrimazole-betamethasone (LOTRISONE) cream  I believe the patient likely had a topical yeast infection/jock itch should become secondarily infected due to SCRATCHING. He now has marketed swelling and induration in this area in addition to the erythema. I  will treat the cellulitis with Keflex 500 mg by mouth 3 times a day for 7 days. I will treat the yeast infection with Lotrisone cream apply twice a day for 7 days. Also recommended warm compresses to help with the swelling and the pain. Reassess in 48 hours if no better or sooner if worse

## 2017-02-25 ENCOUNTER — Encounter: Payer: Self-pay | Admitting: Family Medicine

## 2017-02-25 DIAGNOSIS — M5441 Lumbago with sciatica, right side: Secondary | ICD-10-CM

## 2017-02-28 MED ORDER — DICLOFENAC SODIUM 75 MG PO TBEC: 75.0000 mg | DELAYED_RELEASE_TABLET | Freq: Two times a day (BID) | ORAL | 3 refills | Status: DC | PRN

## 2017-03-03 ENCOUNTER — Other Ambulatory Visit: Payer: Self-pay | Admitting: Family Medicine

## 2017-03-03 DIAGNOSIS — K219 Gastro-esophageal reflux disease without esophagitis: Secondary | ICD-10-CM

## 2017-03-03 MED ORDER — PANTOPRAZOLE SODIUM 40 MG PO TBEC: 40.0000 mg | DELAYED_RELEASE_TABLET | Freq: Two times a day (BID) | ORAL | 3 refills | Status: DC

## 2017-08-27 ENCOUNTER — Other Ambulatory Visit: Payer: Self-pay | Admitting: Family Medicine

## 2017-09-26 ENCOUNTER — Other Ambulatory Visit: Payer: Managed Care, Other (non HMO)

## 2017-09-26 DIAGNOSIS — I10 Essential (primary) hypertension: Secondary | ICD-10-CM

## 2017-09-26 DIAGNOSIS — E785 Hyperlipidemia, unspecified: Secondary | ICD-10-CM

## 2017-09-27 LAB — COMPREHENSIVE METABOLIC PANEL
AG RATIO: 1.6 (calc) (ref 1.0–2.5)
ALT: 9 U/L (ref 9–46)
AST: 15 U/L (ref 10–35)
Albumin: 4 g/dL (ref 3.6–5.1)
Alkaline phosphatase (APISO): 80 U/L (ref 40–115)
BILIRUBIN TOTAL: 0.4 mg/dL (ref 0.2–1.2)
BUN: 14 mg/dL (ref 7–25)
CALCIUM: 9.1 mg/dL (ref 8.6–10.3)
CHLORIDE: 104 mmol/L (ref 98–110)
CO2: 27 mmol/L (ref 20–32)
Creat: 0.96 mg/dL (ref 0.70–1.33)
GLOBULIN: 2.5 g/dL (ref 1.9–3.7)
Glucose, Bld: 99 mg/dL (ref 65–99)
POTASSIUM: 4.6 mmol/L (ref 3.5–5.3)
SODIUM: 140 mmol/L (ref 135–146)
TOTAL PROTEIN: 6.5 g/dL (ref 6.1–8.1)

## 2017-09-27 LAB — LIPID PANEL
Cholesterol: 252 mg/dL — ABNORMAL HIGH (ref <200)
HDL: 39 mg/dL — ABNORMAL LOW (ref >40)
LDL Cholesterol (Calc): 183 mg/dL (calc) — ABNORMAL HIGH
NON-HDL CHOLESTEROL (CALC): 213 mg/dL — AB (ref <130)
Total CHOL/HDL Ratio: 6.5 (calc) — ABNORMAL HIGH (ref <5.0)
Triglycerides: 152 mg/dL — ABNORMAL HIGH (ref <150)

## 2017-09-27 LAB — EXTRA LAV TOP TUBE

## 2017-11-04 ENCOUNTER — Other Ambulatory Visit: Payer: Self-pay | Admitting: Family Medicine

## 2017-11-04 DIAGNOSIS — E785 Hyperlipidemia, unspecified: Secondary | ICD-10-CM

## 2018-02-16 ENCOUNTER — Ambulatory Visit: Payer: Managed Care, Other (non HMO) | Admitting: Family Medicine

## 2018-02-16 VITALS — BP 124/80 | HR 84 | Temp 98.6°F | Resp 15 | Ht 71.0 in | Wt 288.1 lb

## 2018-02-16 DIAGNOSIS — L03221 Cellulitis of neck: Secondary | ICD-10-CM

## 2018-02-16 DIAGNOSIS — L0211 Cutaneous abscess of neck: Secondary | ICD-10-CM

## 2018-02-16 MED ORDER — HYDROCODONE-ACETAMINOPHEN 5-325 MG PO TABS: 1.0000 | ORAL_TABLET | Freq: Two times a day (BID) | ORAL | 0 refills | Status: AC | PRN

## 2018-02-16 MED ORDER — SULFAMETHOXAZOLE-TRIMETHOPRIM 800-160 MG PO TABS: 1.0000 | ORAL_TABLET | Freq: Two times a day (BID) | ORAL | 0 refills | Status: DC

## 2018-02-16 NOTE — Progress Notes (Signed)
Patient ID: Jorge Bowman, male    DOB: 05-27-1965, 53 y.o.   MRN: [id number]  PCP: Orlena Sheldon, PA-C  Chief Complaint  Patient presents with  . Recurrent Skin Infections    Patient in today with c/o of abscess to left side of neck. Site is red and inflammed. Onset a few months ago. Has gotten larger since onset    Subjective:   Jorge Bowman is a 53 y.o. male, presents to clinic with CC of bump tp left upper back/neck area, has been there for several months, but last week it started to get a little painful, red and larger.  He applied "salves" to "draw it out" and he "mashed on it" and now it is very swollen, red, firm, painful, with pain radiating up neck to back of head.  He has been putting warm compresses on it for the last couple days, no drainage, he's been taking tylenol.  It continues to worsen.   He denies hx of abscesses, skin infections, MRSA, immunocompromise.  Pain is fairly moderate to severe located in the area of the redness and swelling also feels like it radiates up his neck is worse with palpation and movement, no alleviating factors.   Patient Active Problem List   Diagnosis Date Noted  . Screening for colorectal cancer 08/07/2015  . Prostate cancer screening 01/16/2015  . Smoker 07/11/2014  . Family history of premature coronary artery disease 10/09/2012  . Hyperlipidemia   . Hypertension   . Obesity      Prior to Admission medications   Medication Sig Start Date End Date Taking? Authorizing Provider  atorvastatin (LIPITOR) 80 MG tablet TAKE 1 TABLET BY MOUTH AT BEDTIME 11/04/17  Yes Susy Frizzle, MD  clotrimazole-betamethasone (LOTRISONE) cream Apply 1 application topically 2 (two) times daily. 08/23/16  Yes Susy Frizzle, MD  diclofenac (VOLTAREN) 75 MG EC tablet Take 1 tablet (75 mg total) by mouth 2 (two) times daily as needed. 02/28/17  Yes Susy Frizzle, MD  lisinopril-hydrochlorothiazide (PRINZIDE,ZESTORETIC) 20-25 MG tablet TAKE 1 TABLET  BY MOUTH ONCE DAILY 08/29/17  Yes Susy Frizzle, MD  Multiple Vitamin (MULTIVITAMIN) tablet Take 1 tablet by mouth daily.   Yes [provider]  pantoprazole (PROTONIX) 40 MG tablet Take 1 tablet (40 mg total) by mouth 2 (two) times daily. 03/03/17  Yes Susy Frizzle, MD     No Known Allergies   Family History  Problem Relation Age of Onset  . Heart disease Father 61       stent     Social History   Socioeconomic History  . Marital status: Married    Spouse name: Not on file  . Number of children: Not on file  . Years of education: Not on file  . Highest education level: Not on file  Occupational History  . Not on file  Social Needs  . Financial resource strain: Not on file  . Food insecurity:    Worry: Not on file    Inability: Not on file  . Transportation needs:    Medical: Not on file    Non-medical: Not on file  Tobacco Use  . Smoking status: Current Every Day Smoker    Packs/day: 1.00    Types: Cigarettes  . Smokeless tobacco: Never Used  Substance and Sexual Activity  . Alcohol use: No  . Drug use: No  . Sexual activity: Not on file  Lifestyle  . Physical activity:    Days  per week: Not on file    Minutes per session: Not on file  . Stress: Not on file  Relationships  . Social connections:    Talks on phone: Not on file    Gets together: Not on file    Attends religious service: Not on file    Active member of club or organization: Not on file    Attends meetings of clubs or organizations: Not on file    Relationship status: Not on file  . Intimate partner violence:    Fear of current or ex partner: Not on file    Emotionally abused: Not on file    Physically abused: Not on file    Forced sexual activity: Not on file  Other Topics Concern  . 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     Review of Systems  Constitutional: Negative.  Negative for chills,  diaphoresis, fatigue and fever.  HENT: Negative.   Eyes: Negative.   Respiratory: Negative.   Cardiovascular: Negative.   Gastrointestinal: Negative.  Negative for diarrhea, nausea and vomiting.  Endocrine: Negative.   Genitourinary: Negative.   Musculoskeletal: Negative.   Skin: Negative.   Allergic/Immunologic: Negative.  Negative for immunocompromised state.  Neurological: Negative.   Hematological: Negative.   Psychiatric/Behavioral: Negative.   All other systems reviewed and are negative.      Objective:    Vitals:   02/16/18 0822  BP: 124/80  Pulse: 84  Resp: 15  Temp: 98.6 F (37 C)  TempSrc: Oral  SpO2: 97%  Weight: 288 lb 2 oz (130.7 kg)  Height: 5\' 11"  (1.803 m)      Physical Exam Vitals signs and nursing note reviewed.  Constitutional:      General: He is not in acute distress.    Appearance: He is well-developed. He is obese. He is not ill-appearing or toxic-appearing.  HENT:     Head: Normocephalic and atraumatic.     Nose: Nose normal.  Eyes:     General:        Right eye: No discharge.        Left eye: No discharge.     Conjunctiva/sclera: Conjunctivae normal.  Neck:     Trachea: No tracheal deviation.  Cardiovascular:     Rate and Rhythm: Normal rate and regular rhythm.  Pulmonary:     Effort: Pulmonary effort is normal. No respiratory distress.     Breath sounds: No stridor.  Musculoskeletal: Normal range of motion.  Skin:    General: Skin is warm and dry.     Findings: Abscess and erythema present.     Comments: 4x5 cm area of edema and erythema with central 2-69mm pustule located to posterior left lower neck/upper shoulder area.  Central area was slightly fluctuant, but all erythematous area indurated and ttp with ttp to surrounding normal appearing tissue, some occipital lymphadenopathy  On the left.  Neurological:     Mental Status: He is alert.     Motor: No abnormal muscle tone.     Coordination: Coordination normal.  Psychiatric:         Behavior: Behavior normal.       I & D Date/Time: 02/16/2018 9:00 AM Performed by: Delsa Grana, PA-C Authorized by: Delsa Grana, PA-C   Consent:    Consent obtained:  Verbal   Consent given by:  Patient   Risks discussed:  Bleeding, incomplete drainage,  pain and infection Location:    Type:  Abscess   Size:  4x5cm   Location:  Neck (posterior lower left neck/upper shoulder) Pre-procedure details:    Skin preparation:  Alcohol and Betadine Anesthesia (see MAR for exact dosages):    Anesthesia method:  Local infiltration   Local anesthetic:  Lidocaine 1% WITH epi (1 mL) Procedure type:    Complexity:  Simple Procedure details:    Incision types:  Stab incision   Incision depth:  Dermal   Scalpel blade:  11   Drainage:  Bloody   Drainage amount:  Scant   Wound treatment:  Wound left open   Packing materials:  None Post-procedure details:    Patient tolerance of procedure:  Tolerated well, no immediate complications       Assessment & Plan:      ICD-10-CM   1. Cellulitis and abscess of neck L03.221 sulfamethoxazole-trimethoprim (BACTRIM DS,SEPTRA DS) 800-160 MG tablet   L02.11 HYDROcodone-acetaminophen (NORCO/VICODIN) 5-325 MG tablet    I & D    Suspect cyst to neck that has been there for a long time per pt, became inflammed/infected - appeared to have abscess with pusture and some fluctuance, but also cellulitis with amount of induration.  Because of area of body, very superficial I&D was done to attempt to open up area of pustule, there was mostly scant bloody drainage - and I likely did not incise down to the level of purulence - but felt risk was too great w/o bedside US.  Pt to do warm soaks, keep area opened, wound was dressed - educated pt regarding wound care, to take antibiotics, take pain medication sparingly in the next 1 to 2 days.  The area of erythema was marked with permanent marker on the skin he was encouraged to monitor the area to make sure  that erythema was decreasing after 24 to 48 hours of antibiotics and seek immediate follow-up if erythema is worsening or beyond the mark.  Patient was going out of town and he did verbalize the understanding of follow-up precautions.   Delsa Grana, PA-C 02/16/18 8:41 AM

## 2018-02-16 NOTE — Patient Instructions (Signed)
Take antibiotics, do warm compresses at least twice a day, monitor the area of redness, 24 - 48 hours after starting antibiotics the area of redness should be decreasing, if not get seen immediately  Follow up next week if not improving.  Any drainage is GOOD!   Just do not poke it or mash on it too hard, that can make it worse.   Incision and Drainage, Care After Refer to this sheet in the next few weeks. These instructions provide you with information about caring for yourself after your procedure. Your health care provider may also give you more specific instructions. Your treatment has been planned according to current medical practices, but problems sometimes occur. Call your health care provider if you have any problems or questions after your procedure. What can I expect after the procedure? After the procedure, it is common to have:  Pain or discomfort around your incision site.  Drainage from your incision. Follow these instructions at home:  Take over-the-counter and prescription medicines only as told by your health care provider.  If you were prescribed an antibiotic medicine, take it as told by your health care provider.Do not stop taking the antibiotic even if you start to feel better.  Followinstructions from your health care provider about: ? How to take care of your incision. ? When and how you should change your packing and bandage (dressing). Wash your hands with soap and water before you change your dressing. If soap and water are not available, use hand sanitizer. ? When you should remove your dressing.  Do not take baths, swim, or use a hot tub until your health care provider approves.  Keep all follow-up visits as told by your health care provider. This is important.  Check your incision area every day for signs of infection. Check for: ? More redness, swelling, or pain. ? More fluid or blood. ? Warmth. ? Pus or a bad smell. Contact a health care provider  if:  Your cyst or abscess returns.  You have a fever.  You have more redness, swelling, or pain around your incision.  You have more fluid or blood coming from your incision.  Your incision feels warm to the touch.  You have pus or a bad smell coming from your incision. Get help right away if:  You have severe pain or bleeding.  You cannot eat or drink without vomiting.  You have decreased urine output.  You become short of breath.  You have chest pain.  You cough up blood.  The area where the incision and drainage occurred becomes numb or it tingles. This information is not intended to replace advice given to you by your health care provider. Make sure you discuss any questions you have with your health care provider. Document Released: 04/12/2011 Document Revised: 06/20/2015 Document Reviewed: 11/08/2014 Elsevier Interactive Patient Education  2019 [employer]     Skin Abscess  A skin abscess is an infected area on or under your skin that contains a collection of pus and other material. An abscess may also be called a furuncle, carbuncle, or boil. An abscess can occur in or on almost any part of your body. Some abscesses break open (rupture) on their own. Most continue to get worse unless they are treated. The infection can spread deeper into the body and eventually into your blood, which can make you feel ill. Treatment usually involves draining the abscess. What are the causes? An abscess occurs when germs, like bacteria, pass through  your skin and cause an infection. This may be caused by:  A scrape or cut on your skin.  A puncture wound through your skin, including a needle injection or insect bite.  Blocked oil or sweat glands.  Blocked and infected hair follicles.  A cyst that forms beneath your skin (sebaceous cyst) and becomes infected. What increases the risk? This condition is more likely to develop in people who:  Have a weak body defense system  (immune system).  Have diabetes.  Have dry and irritated skin.  Get frequent injections or use illegal IV drugs.  Have a foreign body in a wound, such as a splinter.  Have problems with their lymph system or veins. What are the signs or symptoms? Symptoms of this condition include:  A painful, firm bump under the skin.  A bump with pus at the top. This may break through the skin and drain. Other symptoms include:  Redness surrounding the abscess site.  Warmth.  Swelling of the lymph nodes (glands) near the abscess.  Tenderness.  A sore on the skin. How is this diagnosed? This condition may be diagnosed based on:  A physical exam.  Your medical history.  A sample of pus. This may be used to find out what is causing the infection.  Blood tests.  Imaging tests, such as an ultrasound, CT scan, or MRI. How is this treated? A small abscess that drains on its own may not need treatment. Treatment for larger abscesses may include:  Moist heat or heat pack applied to the area several times a day.  A procedure to drain the abscess (incision and drainage).  Antibiotic medicines. For a severe abscess, you may first get antibiotics through an IV and then change to antibiotics by mouth. Follow these instructions at home: Medicines   Take over-the-counter and prescription medicines only as told by your health care provider.  If you were prescribed an antibiotic medicine, take it as told by your health care provider. Do not stop taking the antibiotic even if you start to feel better. Abscess care   If you have an abscess that has not drained, apply heat to the affected area. Use the heat source that your health care provider recommends, such as a moist heat pack or a heating pad. ? Place a towel between your skin and the heat source. ? Leave the heat on for 20-30 minutes. ? Remove the heat if your skin turns bright red. This is especially important if you are unable to  feel pain, heat, or cold. You may have a greater risk of getting burned.  Follow instructions from your health care provider about how to take care of your abscess. Make sure you: ? Cover the abscess with a bandage (dressing). ? Change your dressing or gauze as told by your health care provider. ? Wash your hands with soap and water before you change the dressing or gauze. If soap and water are not available, use hand sanitizer.  Check your abscess every day for signs of a worsening infection. Check for: ? More redness, swelling, or pain. ? More fluid or blood. ? Warmth. ? More pus or a bad smell. General instructions  To avoid spreading the infection: ? Do not share personal care items, towels, or hot tubs with others. ? Avoid making skin contact with other people.  Keep all follow-up visits as told by your health care provider. This is important. Contact a health care provider if you have:  More redness,  swelling, or pain around your abscess.  More fluid or blood coming from your abscess.  Warm skin around your abscess.  More pus or a bad smell coming from your abscess.  A fever.  Muscle aches.  Chills or a general ill feeling. Get help right away if you:  Have severe pain.  See red streaks on your skin spreading away from the abscess. Summary  A skin abscess is an infected area on or under your skin that contains a collection of pus and other material.  A small abscess that drains on its own may not need treatment.  Treatment for larger abscesses may include having a procedure to drain the abscess and taking an antibiotic. This information is not intended to replace advice given to you by your health care provider. Make sure you discuss any questions you have with your health care provider. Document Released: 10/28/2004 Document Revised: 03/03/2017 Document Reviewed: 03/03/2017 Elsevier Interactive Patient Education  2019 [employer].

## 2018-02-20 ENCOUNTER — Encounter: Payer: Self-pay | Admitting: Family Medicine

## 2018-03-24 ENCOUNTER — Other Ambulatory Visit: Payer: Self-pay | Admitting: Family Medicine

## 2018-06-03 ENCOUNTER — Other Ambulatory Visit: Payer: Self-pay | Admitting: Family Medicine

## 2018-06-03 DIAGNOSIS — K219 Gastro-esophageal reflux disease without esophagitis: Secondary | ICD-10-CM

## 2018-06-03 DIAGNOSIS — E785 Hyperlipidemia, unspecified: Secondary | ICD-10-CM

## 2018-10-13 ENCOUNTER — Other Ambulatory Visit: Payer: Self-pay | Admitting: Family Medicine

## 2018-10-30 ENCOUNTER — Other Ambulatory Visit: Payer: Self-pay

## 2018-10-30 ENCOUNTER — Ambulatory Visit: Payer: Managed Care, Other (non HMO) | Admitting: Family Medicine

## 2018-10-30 ENCOUNTER — Encounter: Payer: Self-pay | Admitting: Family Medicine

## 2018-10-30 VITALS — BP 126/80 | HR 80 | Temp 97.9°F | Resp 18 | Ht 71.0 in | Wt 297.0 lb

## 2018-10-30 DIAGNOSIS — K219 Gastro-esophageal reflux disease without esophagitis: Secondary | ICD-10-CM | POA: Diagnosis not present

## 2018-10-30 DIAGNOSIS — Z13220 Encounter for screening for lipoid disorders: Secondary | ICD-10-CM

## 2018-10-30 NOTE — Progress Notes (Signed)
Subjective:    Patient ID: Jorge Bowman, male    DOB: 1965/04/10, 53 y.o.   MRN: [id number]  Patient is taking Protonix 40 mg daily for acid reflux.  However despite doing this, he continues to report indigestion and a burning sensation radiating up his esophagus.  He also has developed persistent left upper quadrant pain.  There is no exacerbating factor.  There is no alleviating factor.  He denies any substernal pressure.  He denies any angina.  He does report shortness of breath with activity however this is been chronic and he attributes this to deconditioning.  He has a difficult time describing the pain in the left upper quadrant however is below the nipple on his left side and below the margin of the rib.  He is mildly tender to palpation in that area.  There is no palpable mass or enlargement.  He describes it as a burning deep ache in that area.  Food does seem to make it worse.  He has been taking diclofenac recently for his back which I have recommended that he stop as this could potentially contribute to an ulcer.  He denies any melena or hematochezia.  He still has his gallbladder however he has no pain or tenderness to palpation in the right upper quadrant.  He denies any hematemesis nausea or vomiting  Past Medical History:  Diagnosis Date  . DDD (degenerative disc disease), cervical   . DDD (degenerative disc disease), lumbar   . Family history of premature coronary artery disease 10/09/2012  . Hyperlipidemia   . Hypertension   . Obesity    No past surgical history on file. Current Outpatient Medications on File Prior to Visit  Medication Sig Dispense Refill  . atorvastatin (LIPITOR) 80 MG tablet TAKE 1 TABLET BY MOUTH EVERY NIGHT AT BEDTIME 90 tablet 1  . diclofenac (VOLTAREN) 75 MG EC tablet Take 1 tablet (75 mg total) by mouth 2 (two) times daily as needed. 60 tablet 3  . lisinopril-hydrochlorothiazide (ZESTORETIC) 20-25 MG tablet TAKE 1 TABLET BY MOUTH ONCE DAILY 90 tablet 1   . Multiple Vitamin (MULTIVITAMIN) tablet Take 1 tablet by mouth daily.    . pantoprazole (PROTONIX) 40 MG tablet TAKE 1 TABLET BY MOUTH 2 TIMES DAILY 180 tablet 3   No current facility-administered medications on file prior to visit.    No Known Allergies Social History   Socioeconomic History  . Marital status: Married    Spouse name: Not on file  . Number of children: Not on file  . Years of education: Not on file  . Highest education level: Not on file  Occupational History  . Not on file  Social Needs  . Financial resource strain: Not on file  . Food insecurity    Worry: Not on file    Inability: Not on file  . Transportation needs    Medical: Not on file    Non-medical: Not on file  Tobacco Use  . Smoking status: Current Every Day Smoker    Packs/day: 1.00    Types: Cigarettes  . Smokeless tobacco: Never Used  Substance and Sexual Activity  . Alcohol use: No  . Drug use: No  . Sexual activity: Not on file  Lifestyle  . Physical activity    Days per week: Not on file    Minutes per session: Not on file  . Stress: Not on file  Relationships  . Social connections    Talks on phone: Not on file  Gets together: Not on file    Attends religious service: Not on file    Active member of club or organization: Not on file    Attends meetings of clubs or organizations: Not on file    Relationship status: Not on file  . Intimate partner violence    Fear of current or ex partner: Not on file    Emotionally abused: Not on file    Physically abused: Not on file    Forced sexual activity: Not on file  Other Topics Concern  . 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      Review of Systems  Musculoskeletal: Positive for back pain.  All other systems reviewed and are negative.      Objective:   Physical Exam  Constitutional: He appears well-developed and well-nourished. No distress.  HENT:   Right Ear: Tympanic membrane and ear canal normal.  Left Ear: Tympanic membrane and ear canal normal.  Neck: Normal range of motion. Neck supple. No JVD present. No thyromegaly present.  Cardiovascular: Normal rate, regular rhythm and normal heart sounds.  Pulmonary/Chest: Effort normal and breath sounds normal. No stridor. No respiratory distress. He has no wheezes. He has no rales. He exhibits no tenderness.  Abdominal: Soft. Bowel sounds are normal. He exhibits no distension and no mass. There is no abdominal tenderness. There is no rebound and no guarding.  Lymphadenopathy:    He has no cervical adenopathy.  Skin: He is not diaphoretic.  Vitals reviewed.         Assessment & Plan:  Gastroesophageal reflux disease, esophagitis presence not specified - Plan: H. pylori breath test, CBC with Differential/Platelet, COMPLETE METABOLIC PANEL WITH GFR, Lipase  Screening cholesterol level - Plan: Lipid panel  I have recommended discontinuation of NSAIDs.  I will check a CBC, CMP, lipase.  I will also perform an H. pylori breath test.  If labs are normal, I would recommend adding sucralfate 1 g p.o. q. before meals at bedtime and consulting GI for an EGD.  Chest pain is actually left upper quadrant abdominal pain does not sound to be cardiac in nature.  Therefore if EGD is normal, may need to get CT scan of the abdomen.  Meanwhile I will checking lab work I will also check the patient's cholesterol.

## 2018-10-31 LAB — LIPID PANEL
Cholesterol: 174 mg/dL (ref <200)
HDL: 36 mg/dL — ABNORMAL LOW (ref >=40)
LDL Cholesterol (Calc): 113 mg/dL (calc) — ABNORMAL HIGH
Non-HDL Cholesterol (Calc): 138 mg/dL (calc) — ABNORMAL HIGH (ref <130)
Total CHOL/HDL Ratio: 4.8 (calc) (ref <5.0)
Triglycerides: 136 mg/dL (ref <150)

## 2018-10-31 LAB — CBC WITH DIFFERENTIAL/PLATELET
Absolute Monocytes: 754 cells/uL (ref 200–950)
Basophils Absolute: 16 cells/uL (ref 0–200)
Basophils Relative: 0.2 %
Eosinophils Absolute: 369 cells/uL (ref 15–500)
Eosinophils Relative: 4.5 %
HCT: 42.2 % (ref 38.5–50.0)
Hemoglobin: 14.2 g/dL (ref 13.2–17.1)
Lymphs Abs: 2222 cells/uL (ref 850–3900)
MCH: 30.2 pg (ref 27.0–33.0)
MCHC: 33.6 g/dL (ref 32.0–36.0)
MCV: 89.8 fL (ref 80.0–100.0)
MPV: 11.2 fL (ref 7.5–12.5)
Monocytes Relative: 9.2 %
Neutro Abs: 4838 cells/uL (ref 1500–7800)
Neutrophils Relative %: 59 %
Platelets: 344 10*3/uL (ref 140–400)
RBC: 4.7 10*6/uL (ref 4.20–5.80)
RDW: 11.8 % (ref 11.0–15.0)
Total Lymphocyte: 27.1 %
WBC: 8.2 10*3/uL (ref 3.8–10.8)

## 2018-10-31 LAB — COMPLETE METABOLIC PANEL WITH GFR
AG Ratio: 1.9 (calc) (ref 1.0–2.5)
ALT: 14 U/L (ref 9–46)
AST: 13 U/L (ref 10–35)
Albumin: 4.3 g/dL (ref 3.6–5.1)
Alkaline phosphatase (APISO): 91 U/L (ref 35–144)
BUN: 22 mg/dL (ref 7–25)
CO2: 30 mmol/L (ref 20–32)
Calcium: 9.2 mg/dL (ref 8.6–10.3)
Chloride: 102 mmol/L (ref 98–110)
Creat: 0.99 mg/dL (ref 0.70–1.33)
GFR, Est African American: 100 mL/min/{1.73_m2} (ref >=60)
GFR, Est Non African American: 86 mL/min/{1.73_m2} (ref >=60)
Globulin: 2.3 g/dL (calc) (ref 1.9–3.7)
Glucose, Bld: 104 mg/dL — ABNORMAL HIGH (ref 65–99)
Potassium: 5.3 mmol/L (ref 3.5–5.3)
Sodium: 139 mmol/L (ref 135–146)
Total Bilirubin: 0.4 mg/dL (ref 0.2–1.2)
Total Protein: 6.6 g/dL (ref 6.1–8.1)

## 2018-10-31 LAB — H. PYLORI BREATH TEST: H. pylori Breath Test: NOT DETECTED

## 2018-10-31 LAB — LIPASE: Lipase: 36 U/L (ref 7–60)

## 2018-11-01 ENCOUNTER — Other Ambulatory Visit: Payer: Self-pay | Admitting: Family Medicine

## 2018-11-01 DIAGNOSIS — K219 Gastro-esophageal reflux disease without esophagitis: Secondary | ICD-10-CM

## 2018-11-01 MED ORDER — SUCRALFATE 1 G PO TABS: 1.0000 g | ORAL_TABLET | Freq: Three times a day (TID) | ORAL | 3 refills | Status: DC

## 2018-11-06 ENCOUNTER — Encounter: Payer: Self-pay | Admitting: Gastroenterology

## 2018-12-08 ENCOUNTER — Ambulatory Visit: Payer: Managed Care, Other (non HMO) | Admitting: Gastroenterology

## 2018-12-25 ENCOUNTER — Other Ambulatory Visit: Payer: Self-pay | Admitting: Family Medicine

## 2018-12-25 DIAGNOSIS — K219 Gastro-esophageal reflux disease without esophagitis: Secondary | ICD-10-CM

## 2019-04-27 ENCOUNTER — Other Ambulatory Visit: Payer: Self-pay | Admitting: Family Medicine

## 2019-05-18 ENCOUNTER — Other Ambulatory Visit: Payer: Self-pay | Admitting: Family Medicine

## 2019-05-18 DIAGNOSIS — E785 Hyperlipidemia, unspecified: Secondary | ICD-10-CM

## 2019-05-28 ENCOUNTER — Encounter: Payer: Self-pay | Admitting: Family Medicine

## 2019-05-28 ENCOUNTER — Ambulatory Visit: Payer: Managed Care, Other (non HMO) | Admitting: Family Medicine

## 2019-05-28 ENCOUNTER — Other Ambulatory Visit: Payer: Self-pay

## 2019-05-28 VITALS — BP 120/80 | HR 84 | Temp 97.5°F | Resp 14 | Ht 71.0 in | Wt 297.0 lb

## 2019-05-28 DIAGNOSIS — E7800 Pure hypercholesterolemia, unspecified: Secondary | ICD-10-CM | POA: Diagnosis not present

## 2019-05-28 DIAGNOSIS — M25562 Pain in left knee: Secondary | ICD-10-CM | POA: Diagnosis not present

## 2019-05-28 DIAGNOSIS — R5382 Chronic fatigue, unspecified: Secondary | ICD-10-CM

## 2019-05-28 MED ORDER — SILDENAFIL CITRATE 100 MG PO TABS: 50.0000 mg | ORAL_TABLET | Freq: Every day | ORAL | 11 refills | Status: DC | PRN

## 2019-05-28 NOTE — Progress Notes (Signed)
Subjective:    Patient ID: Jorge Bowman, male    DOB: 1965/07/11, 54 y.o.   MRN: [id number]  Patient is a very pleasant 54 year old Caucasian male here today for 2 issues.  First he complains of some pain in his left knee.  The pain is located over the lateral joint line.  Patient feels a knot on the superior pole of the patella.  This is tender.  It feels like a little bit of a bone spur in that area.  He also has some tenderness over the lateral joint line.  He reports crepitus with range of motion.  He also complains of erectile dysfunction.  He states over the last few months he has had a difficult time achieving and maintaining an erection.  He denies any problems in the relationship.  He denies any poor libido.  He denies any hematuria or dysuria.  He denies any chest pain or shortness of breath or dyspnea on exertion.  He also has a lesion on the left side of his nasal bridge.  This is an erythematous patch with thick white scale that is approximately 1 cm in size.  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  I have recommended cryotherapy or dermatology consultation.  The patient's wife works with a Paediatric nurse and he would like to see one of the members of her group.  Past Medical History:  Diagnosis Date  . DDD (degenerative disc disease), cervical   . DDD (degenerative disc disease), lumbar   . Family history of premature coronary artery disease 10/09/2012  . Hyperlipidemia   . Hypertension   . Obesity    No past surgical history on file. Current Outpatient Medications on File Prior to Visit  Medication Sig Dispense Refill  . atorvastatin (LIPITOR) 80 MG tablet TAKE 1 TABLET BY MOUTH AT BEDTIME 90 tablet 1  . diclofenac (VOLTAREN) 75 MG EC tablet Take 1 tablet (75 mg total) by mouth 2 (two) times daily as needed. 60 tablet 3  . lisinopril-hydrochlorothiazide (ZESTORETIC) 20-25 MG tablet TAKE 1 TABLET BY MOUTH ONCE DAILY 90 tablet 1  . Multiple Vitamin  (MULTIVITAMIN) tablet Take 1 tablet by mouth daily.    . pantoprazole (PROTONIX) 40 MG tablet TAKE 1 TABLET BY MOUTH TWICE (2) DAILY 180 tablet 3  . sucralfate (CARAFATE) 1 g tablet Take 1 tablet (1 g total) by mouth 4 (four) times daily -  with meals and at bedtime. 120 tablet 3   No current facility-administered medications on file prior to visit.   No Known Allergies Social History   Socioeconomic History  . Marital status: Married    Spouse name: Not on file  . Number of children: Not on file  . Years of education: Not on file  . Highest education level: Not on file  Occupational History  . Not on file  Tobacco Use  . Smoking status: Current Every Day Smoker    Packs/day: 1.00    Types: Cigarettes  . Smokeless tobacco: Never Used  Substance and Sexual Activity  . Alcohol use: No  . Drug use: No  . Sexual activity: Not on file  Other Topics Concern  . 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   Social Determinants of Health   Financial Resource Strain:   . Difficulty of Paying Living Expenses:   Food Insecurity:   . Worried About Estate manager/land agent  of Food in the Last Year:   . [place] in the Last Year:   Transportation Needs:   . Lack of Transportation (Medical):   Marland Kitchen Lack of Transportation (Non-Medical):   Physical Activity:   . Days of Exercise per Week:   . Minutes of Exercise per Session:   Stress:   . Feeling of Stress :   Social Connections:   . Frequency of Communication with Friends and Family:   . Frequency of Social Gatherings with Friends and Family:   . Attends Religious Services:   . Active Member of Clubs or Organizations:   . Attends Archivist Meetings:   Marland Kitchen Marital Status:   Intimate Partner Violence:   . Fear of Current or Ex-Partner:   . Emotionally Abused:   Marland Kitchen Physically Abused:   . Sexually Abused:       Review of Systems  All other systems reviewed and  are negative.      Objective:   Physical Exam  Constitutional: He appears well-developed and well-nourished. No distress.  HENT:  Right Ear: Tympanic membrane and ear canal normal.  Left Ear: Tympanic membrane and ear canal normal.  Nose: Nose normal.    Neck: No JVD present. No thyromegaly present.  Cardiovascular: Normal rate, regular rhythm and normal heart sounds.  Pulmonary/Chest: Effort normal and breath sounds normal. No stridor. No respiratory distress. He has no wheezes. He has no rales. He exhibits no tenderness.  Abdominal: Soft. Bowel sounds are normal. He exhibits no distension and no mass. There is no abdominal tenderness. There is no rebound and no guarding.  Musculoskeletal:     Cervical back: Normal range of motion and neck supple.     Left knee: Bony tenderness present. Tenderness present over the lateral joint line.  Lymphadenopathy:    He has no cervical adenopathy.  Skin: He is not diaphoretic.  Vitals reviewed.         Assessment & Plan:  Acute pain of left knee - Plan: DG Knee Complete 4 Views Left, CANCELED: DG Knee Complete 4 Views Right  Pure hypercholesterolemia - Plan: CBC with Differential/Platelet, COMPLETE METABOLIC PANEL WITH GFR, Lipid panel  Chronic fatigue - Plan: Testosterone Total,Free,Bio, Males   Blood pressure today is excellent.  While the patient is here and fasting I will check a CBC, CMP, fasting lipid panel.  Goal LDL cholesterol is less than 100.  He does complain of some fatigue along with his erectile dysfunction so I will check a testosterone level.  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  Therefore we will try Viagra 50 to 100 mg p.o. daily as needed sexual activity.  We discussed the risk and benefits of this medication as well as the side effects.  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  Recommended x-ray to evaluate further.  Also recommended that he see a dermatologist for evaluation of the lesion on the right side of his nose.  However I suspect squamous cell carcinoma.  I did offer the patient cryotherapy today but he politely declined.

## 2019-05-29 LAB — COMPLETE METABOLIC PANEL WITH GFR
AG Ratio: 1.9 (calc) (ref 1.0–2.5)
ALT: 15 U/L (ref 9–46)
AST: 15 U/L (ref 10–35)
Albumin: 4.3 g/dL (ref 3.6–5.1)
Alkaline phosphatase (APISO): 104 U/L (ref 35–144)
BUN: 21 mg/dL (ref 7–25)
CO2: 29 mmol/L (ref 20–32)
Calcium: 9.3 mg/dL (ref 8.6–10.3)
Chloride: 101 mmol/L (ref 98–110)
Creat: 1.02 mg/dL (ref 0.70–1.33)
GFR, Est African American: 96 mL/min/{1.73_m2} (ref >=60)
GFR, Est Non African American: 83 mL/min/{1.73_m2} (ref >=60)
Globulin: 2.3 g/dL (calc) (ref 1.9–3.7)
Glucose, Bld: 93 mg/dL (ref 65–99)
Potassium: 4.9 mmol/L (ref 3.5–5.3)
Sodium: 139 mmol/L (ref 135–146)
Total Bilirubin: 0.3 mg/dL (ref 0.2–1.2)
Total Protein: 6.6 g/dL (ref 6.1–8.1)

## 2019-05-29 LAB — LIPID PANEL
Cholesterol: 159 mg/dL (ref <200)
HDL: 41 mg/dL (ref >=40)
LDL Cholesterol (Calc): 94 mg/dL (calc)
Non-HDL Cholesterol (Calc): 118 mg/dL (calc) (ref <130)
Total CHOL/HDL Ratio: 3.9 (calc) (ref <5.0)
Triglycerides: 144 mg/dL (ref <150)

## 2019-05-29 LAB — CBC WITH DIFFERENTIAL/PLATELET
Absolute Monocytes: 686 cells/uL (ref 200–950)
Basophils Absolute: 9 cells/uL (ref 0–200)
Basophils Relative: 0.1 %
Eosinophils Absolute: 282 cells/uL (ref 15–500)
Eosinophils Relative: 3.2 %
HCT: 45.3 % (ref 38.5–50.0)
Hemoglobin: 14.7 g/dL (ref 13.2–17.1)
Lymphs Abs: 2455 cells/uL (ref 850–3900)
MCH: 29.7 pg (ref 27.0–33.0)
MCHC: 32.5 g/dL (ref 32.0–36.0)
MCV: 91.5 fL (ref 80.0–100.0)
MPV: 10.8 fL (ref 7.5–12.5)
Monocytes Relative: 7.8 %
Neutro Abs: 5368 cells/uL (ref 1500–7800)
Neutrophils Relative %: 61 %
Platelets: 365 10*3/uL (ref 140–400)
RBC: 4.95 10*6/uL (ref 4.20–5.80)
RDW: 12.4 % (ref 11.0–15.0)
Total Lymphocyte: 27.9 %
WBC: 8.8 10*3/uL (ref 3.8–10.8)

## 2019-05-29 LAB — TESTOSTERONE TOTAL,FREE,BIO, MALES
Albumin: 4.3 g/dL (ref 3.6–5.1)
Sex Hormone Binding: 25 nmol/L (ref 10–50)
Testosterone: 194 ng/dL — ABNORMAL LOW (ref 250–827)

## 2019-06-09 ENCOUNTER — Other Ambulatory Visit: Payer: Self-pay | Admitting: Family Medicine

## 2019-06-28 ENCOUNTER — Encounter: Payer: Self-pay | Admitting: Family Medicine

## 2019-07-20 ENCOUNTER — Other Ambulatory Visit: Payer: Self-pay | Admitting: Family Medicine

## 2019-10-22 ENCOUNTER — Other Ambulatory Visit: Payer: Self-pay

## 2019-10-22 ENCOUNTER — Ambulatory Visit (INDEPENDENT_AMBULATORY_CARE_PROVIDER_SITE_OTHER): Payer: Managed Care, Other (non HMO) | Admitting: Family Medicine

## 2019-10-22 VITALS — BP 120/84 | HR 76 | Temp 98.0°F | Ht 71.0 in | Wt 287.0 lb

## 2019-10-22 DIAGNOSIS — M5432 Sciatica, left side: Secondary | ICD-10-CM

## 2019-10-22 MED ORDER — PREDNISONE 20 MG PO TABS: ORAL_TABLET | ORAL | 0 refills | Status: DC

## 2019-10-22 NOTE — Progress Notes (Signed)
Subjective:    Patient ID: Jorge Bowman, male    DOB: July 04, 1965, 54 y.o.   MRN: [id number]  HPI Patient presents today with 3-day history of left-sided sciatica.  He reports pain radiating into his left gluteus.  This wraps around the lateral aspect of his left thigh onto his anterior left thigh and then down his left leg all the way into his foot.  He describes it as a burning aching and stinging pain.  He denies any leg weakness.  He denies any bowel or bladder incontinence.  He denies any saddle anesthesia.  He believes it was triggered by sitting on a new seat in his truck at work.  This seat provides little support and was extremely rough as he was driving and he believes this aggravated his back. Past Medical History:  Diagnosis Date  . DDD (degenerative disc disease), cervical   . DDD (degenerative disc disease), lumbar   . Family history of premature coronary artery disease 10/09/2012  . Hyperlipidemia   . Hypertension   . Obesity    No past surgical history on file. Current Outpatient Medications on File Prior to Visit  Medication Sig Dispense Refill  . atorvastatin (LIPITOR) 80 MG tablet TAKE 1 TABLET BY MOUTH AT BEDTIME 90 tablet 1  . lisinopril-hydrochlorothiazide (ZESTORETIC) 20-25 MG tablet TAKE 1 TABLET BY MOUTH ONCE DAILY 90 tablet 1  . Multiple Vitamin (MULTIVITAMIN) tablet Take 1 tablet by mouth daily.    . pantoprazole (PROTONIX) 40 MG tablet TAKE 1 TABLET BY MOUTH TWICE (2) DAILY 180 tablet 3  . sildenafil (VIAGRA) 100 MG tablet Take 0.5-1 tablets (50-100 mg total) by mouth daily as needed for erectile dysfunction. 5 tablet 11  . sucralfate (CARAFATE) 1 g tablet TAKE 1 TABLET BY MOUTH 4 TIMES A DAY WITH MEALS AND AT BEDTIME. 120 tablet 3   No current facility-administered medications on file prior to visit.   No Known Allergies Social History   Socioeconomic History  . Marital status: Married    Spouse name: Not on file  . Number of children: Not on file  .  Years of education: Not on file  . Highest education level: Not on file  Occupational History  . Not on file  Tobacco Use  . Smoking status: Current Every Day Smoker    Packs/day: 1.00    Types: Cigarettes  . Smokeless tobacco: Never Used  Substance and Sexual Activity  . Alcohol use: No  . Drug use: No  . Sexual activity: Not on file  Other Topics Concern  . 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   Social Determinants of Health   Financial Resource Strain:   . Difficulty of Paying Living Expenses: Not on file  Food Insecurity:   . Worried About Charity fundraiser in the Last Year: Not on file  . Ran Out of Food in the Last Year: Not on file  Transportation Needs:   . Lack of Transportation (Medical): Not on file  . Lack of Transportation (Non-Medical): Not on file  Physical Activity:   . Days of Exercise per Week: Not on file  . Minutes of Exercise per Session: Not on file  Stress:   . Feeling of Stress : Not on file  Social Connections:   . Frequency of Communication with Friends and Family: Not on file  . Frequency of Social Gatherings with Friends and  Family: Not on file  . Attends Religious Services: Not on file  . Active Member of Clubs or Organizations: Not on file  . Attends Archivist Meetings: Not on file  . Marital Status: Not on file  Intimate Partner Violence:   . Fear of Current or Ex-Partner: Not on file  . Emotionally Abused: Not on file  . Physically Abused: Not on file  . Sexually Abused: Not on file      Review of Systems  All other systems reviewed and are negative.      Objective:   Physical Exam Vitals reviewed.  Cardiovascular:     Rate and Rhythm: Normal rate and regular rhythm.     Heart sounds: Normal heart sounds.  Pulmonary:     Effort: Pulmonary effort is normal.     Breath sounds: Normal breath sounds.  Musculoskeletal:       Back:        Legs:           Assessment & Plan:  Left sided sciatica  Begin prednisone taper pack.  Recheck in 1 week if no better or sooner if worse.  Use Tylenol for pain

## 2019-10-25 ENCOUNTER — Telehealth: Payer: Self-pay | Admitting: Family Medicine

## 2019-10-25 NOTE — Telephone Encounter (Signed)
CB# [phone] Was seen on 10/22/2019 need a work note fax # [phone] Atten: [employer]  ( was seen in the office on 09/20/2021out of working starting 10/24/2019 thru 10/26/2019 return on 10/29/2018 please

## 2019-10-31 ENCOUNTER — Encounter: Payer: Self-pay | Admitting: Family Medicine

## 2019-10-31 NOTE — Telephone Encounter (Signed)
Letter written for return to work Reviewed note from PCP visit for sciatica

## 2019-11-01 NOTE — Telephone Encounter (Signed)
Dr Buelah Manis completed work note on 10/31/2019.

## 2019-11-02 ENCOUNTER — Other Ambulatory Visit: Payer: Self-pay | Admitting: Family Medicine

## 2019-11-02 DIAGNOSIS — E785 Hyperlipidemia, unspecified: Secondary | ICD-10-CM

## 2019-11-12 ENCOUNTER — Telehealth: Payer: Self-pay

## 2019-11-12 NOTE — Telephone Encounter (Signed)
Koen is following up on FMLA paperwork he needs by the 14th please call Anastasia Pall [phone]. I have check up front and no FMLA papers up front

## 2019-11-15 NOTE — Telephone Encounter (Signed)
Please F/u with this

## 2020-01-21 ENCOUNTER — Other Ambulatory Visit: Payer: Self-pay

## 2020-01-21 ENCOUNTER — Encounter: Payer: Self-pay | Admitting: Emergency Medicine

## 2020-01-21 ENCOUNTER — Emergency Department
Admission: EM | Admit: 2020-01-21 | Discharge: 2020-01-21 | Disposition: A | Discharge status: Home / Self Care | Payer: Managed Care, Other (non HMO) | Attending: Emergency Medicine | Admitting: Emergency Medicine

## 2020-01-21 ENCOUNTER — Emergency Department: Payer: Managed Care, Other (non HMO)

## 2020-01-21 DIAGNOSIS — R0602 Shortness of breath: Secondary | ICD-10-CM

## 2020-01-21 DIAGNOSIS — F17210 Nicotine dependence, cigarettes, uncomplicated: Secondary | ICD-10-CM | POA: Diagnosis not present

## 2020-01-21 DIAGNOSIS — R0989 Other specified symptoms and signs involving the circulatory and respiratory systems: Secondary | ICD-10-CM | POA: Insufficient documentation

## 2020-01-21 DIAGNOSIS — R61 Generalized hyperhidrosis: Secondary | ICD-10-CM | POA: Diagnosis not present

## 2020-01-21 DIAGNOSIS — Z79899 Other long term (current) drug therapy: Secondary | ICD-10-CM | POA: Diagnosis not present

## 2020-01-21 DIAGNOSIS — I10 Essential (primary) hypertension: Secondary | ICD-10-CM | POA: Insufficient documentation

## 2020-01-21 LAB — TROPONIN I (HIGH SENSITIVITY)
Troponin I (High Sensitivity): 7 ng/L (ref <18)
Troponin I (High Sensitivity): 6 ng/L (ref <18)

## 2020-01-21 LAB — BASIC METABOLIC PANEL WITH GFR
Anion gap: 9 (ref 5–15)
BUN: 17 mg/dL (ref 6–20)
CO2: 30 mmol/L (ref 22–32)
Calcium: 8.6 mg/dL — ABNORMAL LOW (ref 8.9–10.3)
Chloride: 102 mmol/L (ref 98–111)
Creatinine, Ser: 0.84 mg/dL (ref 0.61–1.24)
GFR, Estimated: >60 mL/min (ref >60)
Glucose, Bld: 114 mg/dL — ABNORMAL HIGH (ref 70–99)
Potassium: 3.6 mmol/L (ref 3.5–5.1)
Sodium: 141 mmol/L (ref 135–145)

## 2020-01-21 LAB — CBC
HCT: 40.3 % (ref 39.0–52.0)
Hemoglobin: 13 g/dL (ref 13.0–17.0)
MCH: 30.1 pg (ref 26.0–34.0)
MCHC: 32.3 g/dL (ref 30.0–36.0)
MCV: 93.3 fL (ref 80.0–100.0)
Platelets: 313 10*3/uL (ref 150–400)
RBC: 4.32 MIL/uL (ref 4.22–5.81)
RDW: 12 % (ref 11.5–15.5)
WBC: 10.3 10*3/uL (ref 4.0–10.5)
nRBC: 0 % (ref 0.0–0.2)

## 2020-01-21 NOTE — ED Provider Notes (Signed)
[facility] Emergency Department Provider Note  ____________________________________________  Time seen: Approximately 9:40 AM  I have reviewed the triage vital signs and the nursing notes.   HISTORY  Chief Complaint Shortness of Breath    HPI Jorge Bowman is a 54 y.o. male with history of hypertension hyperlipidemia obesity and smoking who comes to the ED complaining of shortness of breath and sweating that started this morning and woke him up from sleep.  Denies any chest pain.  No aggravating or alleviating factors.  This is in the setting of longstanding chest congestion has been going on for several months.  Denies fever.  No unexplained weight loss.  No excessive hot or cold.     Past Medical History:  Diagnosis Date  . DDD (degenerative disc disease), cervical   . DDD (degenerative disc disease), lumbar   . Family history of premature coronary artery disease 10/09/2012  . Hyperlipidemia   . Hypertension   . Obesity      Patient Active Problem List   Diagnosis Date Noted  . Screening for colorectal cancer 08/07/2015  . Prostate cancer screening 01/16/2015  . Smoker 07/11/2014  . Family history of premature coronary artery disease 10/09/2012  . Hyperlipidemia   . Hypertension   . Obesity      History reviewed. No pertinent surgical history.   Prior to Admission medications   Medication Sig Start Date End Date Taking? Authorizing Provider  atorvastatin (LIPITOR) 80 MG tablet TAKE 1 TABLET BY MOUTH AT BEDTIME 11/05/19   Susy Frizzle, MD  lisinopril-hydrochlorothiazide (ZESTORETIC) 20-25 MG tablet TAKE 1 TABLET BY MOUTH ONCE DAILY 07/20/19   Susy Frizzle, MD  Multiple Vitamin (MULTIVITAMIN) tablet Take 1 tablet by mouth daily.    [provider]  pantoprazole (PROTONIX) 40 MG tablet TAKE 1 TABLET BY MOUTH TWICE (2) DAILY 12/25/18   Susy Frizzle, MD  predniSONE (DELTASONE) 20 MG tablet 3 tabs poqday 1-2, 2 tabs poqday  3-4, 1 tab poqday 5-6 10/22/19   Susy Frizzle, MD  sildenafil (VIAGRA) 100 MG tablet Take 0.5-1 tablets (50-100 mg total) by mouth daily as needed for erectile dysfunction. 05/28/19   Susy Frizzle, MD  sucralfate (CARAFATE) 1 g tablet TAKE 1 TABLET BY MOUTH 4 TIMES A DAY WITH MEALS AND AT BEDTIME. 06/11/19   Susy Frizzle, MD     Allergies Patient has no known allergies.   Family History  Problem Relation Age of Onset  . Heart disease Father 50       stent    Social History Social History   Tobacco Use  . Smoking status: Current Every Day Smoker    Packs/day: 1.00    Types: Cigarettes  . Smokeless tobacco: Never Used  Substance Use Topics  . Alcohol use: Yes    Comment: occ  . Drug use: No    Review of Systems  Constitutional:   No fever or chills.  ENT:   No sore throat. No rhinorrhea. Cardiovascular:   No chest pain or syncope. Respiratory: Positive shortness of breath without cough. Gastrointestinal:   Negative for abdominal pain, vomiting and diarrhea.  Musculoskeletal:   Negative for focal pain or swelling All other systems reviewed and are negative except as documented above in ROS and HPI.  ____________________________________________   PHYSICAL EXAM:  VITAL SIGNS: ED Triage Vitals  Enc Vitals Group     BP 01/21/20 0530 134/77     Pulse Rate 01/21/20 0530 81  Resp 01/21/20 0530 18     Temp 01/21/20 0530 97.7 F (36.5 C)     Temp Source 01/21/20 0530 Oral     SpO2 01/21/20 0530 95 %     Weight 01/21/20 0531 290 lb (131.5 kg)     Height 01/21/20 0531 5\' 11"  (1.803 m)     Head Circumference --      Peak Flow --      Pain Score 01/21/20 0531 0     Pain Loc --      Pain Edu? --      Excl. in [place]? --     Vital signs reviewed, nursing assessments reviewed.   Constitutional:   Alert and oriented. Non-toxic appearance. Eyes:   Conjunctivae are normal. EOMI. PERRL. ENT      Head:   Normocephalic and atraumatic.      Nose:   Wearing a  mask.      Mouth/Throat:   Wearing a mask.      Neck:   No meningismus. Full ROM. Hematological/Lymphatic/Immunilogical:   No cervical lymphadenopathy. Cardiovascular:   RRR. Symmetric bilateral radial and DP pulses.  No murmurs. Cap refill less than 2 seconds. Respiratory:   Normal respiratory effort without tachypnea/retractions. Breath sounds are clear and equal bilaterally. No wheezes/rales/rhonchi.  No inducible wheezing or cough with FEV1 maneuver. Gastrointestinal:   Soft and nontender. Non distended. There is no CVA tenderness.  No rebound, rigidity, or guarding. Musculoskeletal:   Normal range of motion in all extremities. No joint effusions.  No lower extremity tenderness.  No edema. Neurologic:   Normal speech and language.  Motor grossly intact. No acute focal neurologic deficits are appreciated.  Skin:    Skin is warm, dry and intact. No rash noted.  No petechiae, purpura, or bullae.  ____________________________________________    LABS (pertinent positives/negatives) (all labs ordered are listed, but only abnormal results are displayed) Labs Reviewed  BASIC METABOLIC PANEL - Abnormal; Notable for the following components:      Result Value   Glucose, Bld 114 (*)    Calcium 8.6 (*)    All other components within normal limits  CBC  TROPONIN I (HIGH SENSITIVITY)  TROPONIN I (HIGH SENSITIVITY)   ____________________________________________   EKG  Interpreted by me Normal sinus rhythm rate of 74, left axis, right bundle branch block.  Normal ST segments and T waves.  No acute ischemic changes.  No evidence of right heart strain.  ____________________________________________    RADIOLOGY  DG Chest 2 View  Result Date: 01/21/2020 CLINICAL DATA:  Shortness of breath. EXAM: CHEST - 2 VIEW COMPARISON:  Lumbar spine series 04/28/2015. FINDINGS: Mediastinum and hilar structures normal. Cardiomegaly. No pulmonary venous congestion. No focal infiltrate. No pleural  effusion or pneumothorax. Degenerative change thoracic spine. Mild thoracic spine compression fractures again noted. Similar findings noted on prior lumbar spine series. IMPRESSION: 1. Cardiomegaly. No pulmonary venous congestion. 2. No acute pulmonary disease. Electronically Signed   By: Marcello Moores  Register   On: 01/21/2020 06:01    ____________________________________________   PROCEDURES Procedures  ____________________________________________  DIFFERENTIAL DIAGNOSIS   Non-STEMI, COPD, pneumonia, pneumothorax  CLINICAL IMPRESSION / ASSESSMENT AND PLAN / ED COURSE  Medications ordered in the ED: Medications - No data to display  Pertinent labs & imaging results that were available during my care of the patient were reviewed by me and considered in my medical decision making (see chart for details).  Creighton Lipari was evaluated in Emergency Department on 01/21/2020 for the  symptoms described in the history of present illness. He was evaluated in the context of the global COVID-19 pandemic, which necessitated consideration that the patient might be at risk for infection with the SARS-CoV-2 virus that causes COVID-19. Institutional protocols and algorithms that pertain to the evaluation of patients at risk for COVID-19 are in a state of rapid change based on information released by regulatory bodies including the CDC and federal and state organizations. These policies and algorithms were followed during the patient's care in the ED.   Patient presents with episode of shortness of breath and diaphoresis this morning, feeling better now.  Also reports chronic congestion in the chest for several months, not resolved with guaifenesin.  Exam is reassuring.  Vital signs are normal, EKG is nonischemic.  Chest x-ray and labs including serial troponins are all normal.  He has had no chest pain, I have low suspicion for ACS PE dissection AAA or pericarditis.  No evidence of pneumothorax or pneumonia or  pulmonary edema.  He is a chronic smoker, suspect a degree of COPD, recommend follow-up with pulmonology.      ____________________________________________   FINAL CLINICAL IMPRESSION(S) / ED DIAGNOSES    Final diagnoses:  Shortness of breath     ED Discharge Orders    None      Portions of this note were generated with dragon dictation software. Dictation errors may occur despite best attempts at proofreading.   Carrie Mew, MD 01/21/20 [phone]

## 2020-01-21 NOTE — ED Notes (Addendum)
Pt presents to the ED for chest congestion, SOB, and sweats. Pt is A&Ox4 and NAD at this time but said he had a episode where he felt that way before. Denies any needs at this point.

## 2020-01-21 NOTE — Discharge Instructions (Signed)
Your EKG, chest x-ray, and lab tests were all normal today.  Your examination was also reassuring.  Please follow-up with your doctor and pulmonology for further evaluation of your symptoms.

## 2020-01-21 NOTE — ED Triage Notes (Signed)
Patient states that he has been congested times one month. Patient states that he woke up this morning feeling short of breath.

## 2020-02-13 DIAGNOSIS — Z8249 Family history of ischemic heart disease and other diseases of the circulatory system: Secondary | ICD-10-CM | POA: Insufficient documentation

## 2020-02-22 ENCOUNTER — Telehealth: Payer: Self-pay | Admitting: *Deleted

## 2020-02-22 NOTE — Telephone Encounter (Signed)
Received referral for low dose lung cancer screening CT scan. Message left at phone number listed in EMR for patient to call me back to facilitate scheduling scan.  

## 2020-04-12 ENCOUNTER — Other Ambulatory Visit: Payer: Self-pay | Admitting: Family Medicine

## 2020-04-12 DIAGNOSIS — K219 Gastro-esophageal reflux disease without esophagitis: Secondary | ICD-10-CM

## 2020-04-12 DIAGNOSIS — E785 Hyperlipidemia, unspecified: Secondary | ICD-10-CM

## 2020-05-12 ENCOUNTER — Telehealth: Payer: Self-pay | Admitting: *Deleted

## 2020-05-12 NOTE — Telephone Encounter (Signed)
Received referral for low dose lung cancer screening CT scan. Message left at phone number listed in EMR for patient to call me back to facilitate scheduling scan.  

## 2020-05-19 ENCOUNTER — Encounter: Payer: Self-pay | Admitting: *Deleted

## 2020-11-06 ENCOUNTER — Other Ambulatory Visit: Payer: Self-pay | Admitting: Family Medicine

## 2020-11-06 DIAGNOSIS — E785 Hyperlipidemia, unspecified: Secondary | ICD-10-CM

## 2020-12-19 ENCOUNTER — Other Ambulatory Visit: Payer: Self-pay | Admitting: Family Medicine

## 2020-12-19 DIAGNOSIS — E785 Hyperlipidemia, unspecified: Secondary | ICD-10-CM

## 2021-01-20 ENCOUNTER — Other Ambulatory Visit: Payer: Self-pay | Admitting: Family Medicine

## 2021-01-20 DIAGNOSIS — E785 Hyperlipidemia, unspecified: Secondary | ICD-10-CM

## 2021-04-01 ENCOUNTER — Other Ambulatory Visit: Payer: Self-pay

## 2021-04-01 DIAGNOSIS — E785 Hyperlipidemia, unspecified: Secondary | ICD-10-CM

## 2021-04-01 MED ORDER — ATORVASTATIN CALCIUM 80 MG PO TABS: 80.0000 mg | ORAL_TABLET | Freq: Every day | ORAL | 0 refills | Status: DC

## 2021-04-13 ENCOUNTER — Ambulatory Visit: Payer: Managed Care, Other (non HMO) | Admitting: Family Medicine

## 2021-04-20 ENCOUNTER — Other Ambulatory Visit: Payer: Self-pay

## 2021-04-20 ENCOUNTER — Ambulatory Visit (INDEPENDENT_AMBULATORY_CARE_PROVIDER_SITE_OTHER): Payer: Managed Care, Other (non HMO) | Admitting: Family Medicine

## 2021-04-20 VITALS — BP 140/98 | HR 52 | Temp 98.9°F | Ht 70.0 in | Wt 301.2 lb

## 2021-04-20 DIAGNOSIS — Z125 Encounter for screening for malignant neoplasm of prostate: Secondary | ICD-10-CM

## 2021-04-20 DIAGNOSIS — E7800 Pure hypercholesterolemia, unspecified: Secondary | ICD-10-CM

## 2021-04-20 DIAGNOSIS — Z0000 Encounter for general adult medical examination without abnormal findings: Secondary | ICD-10-CM

## 2021-04-20 DIAGNOSIS — I10 Essential (primary) hypertension: Secondary | ICD-10-CM

## 2021-04-20 DIAGNOSIS — Z0001 Encounter for general adult medical examination with abnormal findings: Secondary | ICD-10-CM

## 2021-04-20 DIAGNOSIS — Z1211 Encounter for screening for malignant neoplasm of colon: Secondary | ICD-10-CM

## 2021-04-20 NOTE — Progress Notes (Signed)
? ?Subjective:  ? ? Patient ID: Jorge Bowman, male    DOB: 15-Mar-1965, 56 y.o.   MRN: [id number] ? ?Patient is a very pleasant 56 year old Caucasian gentleman here today for complete physical exam.  His blood pressure today is elevated.  He states that he checks his blood pressure at home and he is never seen at this time.  I verified the number personally and got similar readings.  He states that he has been checking his blood pressure with electronic cuff and that his diastolic blood pressure is never near 100.  He is overdue for colonoscopy.  We discussed this and he prefers a Cologuard.  He is due for prostate cancer screening.  He is due for shingles shot.  He is also due for a tetanus shot.  We discussed the COVID booster.  He defers these at the present time.  Unfortunately he continues to smoke.  He is in the precontemplative phase. ? ?Past Medical History:  ?Diagnosis Date  ? DDD (degenerative disc disease), cervical   ? DDD (degenerative disc disease), lumbar   ? Family history of premature coronary artery disease 10/09/2012  ? Hyperlipidemia   ? Hypertension   ? Obesity   ? ?No past surgical history on file. ?Current Outpatient Medications on File Prior to Visit  ?Medication Sig Dispense Refill  ? atorvastatin (LIPITOR) 80 MG tablet Take 1 tablet (80 mg total) by mouth at bedtime. 30 tablet 0  ? lisinopril-hydrochlorothiazide (ZESTORETIC) 20-25 MG tablet TAKE 1 TABLET BY MOUTH ONCE DAILY 90 tablet 1  ? Multiple Vitamin (MULTIVITAMIN) tablet Take 1 tablet by mouth daily.    ? pantoprazole (PROTONIX) 40 MG tablet TAKE 1 TABLET BY MOUTH TWICE (2) DAILY 180 tablet 3  ? predniSONE (DELTASONE) 20 MG tablet 3 tabs poqday 1-2, 2 tabs poqday 3-4, 1 tab poqday 5-6 12 tablet 0  ? sildenafil (VIAGRA) 100 MG tablet Take 0.5-1 tablets (50-100 mg total) by mouth daily as needed for erectile dysfunction. 5 tablet 11  ? sucralfate (CARAFATE) 1 g tablet TAKE 1 TABLET BY MOUTH 4 TIMES A DAY WITH MEALS AND AT BEDTIME. 120  tablet 3  ? ?No current facility-administered medications on file prior to visit.  ? ?No Known Allergies ?Social History  ? ?Socioeconomic History  ? Marital status: Married  ?  Spouse name: Not on file  ? Number of children: Not on file  ? Years of education: Not on file  ? Highest education level: Not on file  ?Occupational History  ? Not on file  ?Tobacco Use  ? Smoking status: Every Day  ?  Packs/day: 1.00  ?  Types: Cigarettes  ? Smokeless tobacco: Never  ?Substance and Sexual Activity  ? Alcohol use: Yes  ?  Comment: occ  ? Drug use: No  ? Sexual activity: Not on file  ?Other Topics Concern  ? Not on file  ?Social History Narrative  ? 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  ? ?Social Determinants of Health  ? ?Financial Resource Strain: Not on file  ?Food Insecurity: Not on file  ?Transportation Needs: Not on file  ?Physical Activity: Not on file  ?Stress: Not on file  ?Social Connections: Not on file  ?Intimate Partner Violence: Not on file  ? ?Family History  ?Problem Relation Age of Onset  ? Heart disease Father 3  ?     stent  ? ? ? ? ?Review of Systems  ?All other systems  reviewed and are negative. ? ?   ?Objective:  ? Physical Exam ?Vitals reviewed.  ?Constitutional:   ?   General: He is not in acute distress. ?   Appearance: He is well-developed. He is obese. He is not ill-appearing, toxic-appearing or diaphoretic.  ?HENT:  ?   Head: Normocephalic and atraumatic.  ?   Right Ear: Tympanic membrane and ear canal normal.  ?   Left Ear: Tympanic membrane and ear canal normal.  ?   Nose: Nose normal. No congestion.  ?   Mouth/Throat:  ?   Mouth: Mucous membranes are moist.  ?   Pharynx: Oropharynx is clear. No posterior oropharyngeal erythema.  ?Eyes:  ?   Extraocular Movements: Extraocular movements intact.  ?   Conjunctiva/sclera: Conjunctivae normal.  ?   Pupils: Pupils are equal, round, and reactive to light.  ?Neck:  ?   Thyroid: No thyromegaly.  ?   Vascular: No carotid  bruit or JVD.  ?Cardiovascular:  ?   Rate and Rhythm: Normal rate and regular rhythm.  ?   Heart sounds: Normal heart sounds. No murmur heard. ?  No friction rub. No gallop.  ?Pulmonary:  ?   Effort: Pulmonary effort is normal. No respiratory distress.  ?   Breath sounds: Normal breath sounds. No stridor. No wheezing or rales.  ?Chest:  ?   Chest wall: No tenderness.  ?Abdominal:  ?   General: Bowel sounds are normal. There is no distension.  ?   Palpations: Abdomen is soft. There is no mass.  ?   Tenderness: There is no abdominal tenderness. There is no guarding or rebound.  ?Musculoskeletal:     ?   General: No swelling or deformity.  ?   Cervical back: Normal range of motion and neck supple. No rigidity.  ?   Right lower leg: No edema.  ?   Left lower leg: No edema.  ?Lymphadenopathy:  ?   Cervical: No cervical adenopathy.  ?Skin: ?   General: Skin is warm.  ?   Coloration: Skin is not jaundiced or pale.  ?   Findings: No bruising, erythema, lesion or rash.  ?Neurological:  ?   General: No focal deficit present.  ?   Mental Status: He is alert and oriented to person, place, and time. Mental status is at baseline.  ?   Cranial Nerves: No cranial nerve deficit.  ?   Sensory: No sensory deficit.  ?   Motor: No weakness.  ?   Coordination: Coordination normal.  ?   Gait: Gait normal.  ?   Deep Tendon Reflexes: Reflexes normal.  ? ? ? ? ? ?   ?Assessment & Plan:  ?General medical exam - Plan: CBC with Differential/Platelet, Lipid panel, COMPLETE METABOLIC PANEL WITH GFR, PSA ? ?Pure hypercholesterolemia - Plan: CBC with Differential/Platelet, Lipid panel, COMPLETE METABOLIC PANEL WITH GFR, PSA ? ?Prostate cancer screening - Plan: CBC with Differential/Platelet, Lipid panel, COMPLETE METABOLIC PANEL WITH GFR, PSA ? ?Benign essential HTN - Plan: CBC with Differential/Platelet, Lipid panel, COMPLETE METABOLIC PANEL WITH GFR, PSA ? ?Colon cancer screening - Plan: Cologuard ?He declines a colonoscopy but he does consent to  Cologuard so I will schedule this for him.  I will check a PSA to screen for prostate cancer.  I recommended the shingles vaccine, a tetanus shot, and a COVID booster.  He will consider these but at the present time he politely defers them.  I will check a CBC, CMP, lipid panel,  and a PSA.  I am concerned by his blood pressure.  Of asked the patient to have his wife check his blood pressure.  She works as a Psychologist, sport and exercise at a [employer].  Also would like to verify that his cuff is accurate.  His blood pressure could falsely be elevated this morning due to whitecoat syndrome but I want to verify this at home with some accurate readings.  If consistently greater than 140/90, I would recommend additional medication to bring it to goal.  Strongly recommend smoking cessation.  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  This essentially obscures his posterior oropharynx.  Based on this, and his BMI, I am very concerned about obstructive sleep apnea.  At the present time, the patient states that he is asymptomatic.  Of asked him to consider a sleep study.  If he changes his mind I will be happy to schedule this for him. ?

## 2021-04-21 ENCOUNTER — Encounter: Payer: Self-pay | Admitting: Family Medicine

## 2021-04-21 LAB — CBC WITH DIFFERENTIAL/PLATELET
Absolute Monocytes: 919 cells/uL (ref 200–950)
Basophils Absolute: 141 cells/uL (ref 0–200)
Basophils Relative: 1.4 %
Eosinophils Absolute: 424 cells/uL (ref 15–500)
Eosinophils Relative: 4.2 %
HCT: 42.6 % (ref 38.5–50.0)
Hemoglobin: 14.1 g/dL (ref 13.2–17.1)
Lymphs Abs: 3515 cells/uL (ref 850–3900)
MCH: 29.3 pg (ref 27.0–33.0)
MCHC: 33.1 g/dL (ref 32.0–36.0)
MCV: 88.6 fL (ref 80.0–100.0)
MPV: 11 fL (ref 7.5–12.5)
Monocytes Relative: 9.1 %
Neutro Abs: 5101 cells/uL (ref 1500–7800)
Neutrophils Relative %: 50.5 %
Platelets: 371 10*3/uL (ref 140–400)
RBC: 4.81 10*6/uL (ref 4.20–5.80)
RDW: 13.1 % (ref 11.0–15.0)
Total Lymphocyte: 34.8 %
WBC: 10.1 10*3/uL (ref 3.8–10.8)

## 2021-04-21 LAB — COMPLETE METABOLIC PANEL WITH GFR
AG Ratio: 1.7 (calc) (ref 1.0–2.5)
ALT: 14 U/L (ref 9–46)
AST: 14 U/L (ref 10–35)
Albumin: 4.2 g/dL (ref 3.6–5.1)
Alkaline phosphatase (APISO): 129 U/L (ref 35–144)
BUN: 13 mg/dL (ref 7–25)
CO2: 28 mmol/L (ref 20–32)
Calcium: 9.4 mg/dL (ref 8.6–10.3)
Chloride: 102 mmol/L (ref 98–110)
Creat: 1.08 mg/dL (ref 0.70–1.30)
Globulin: 2.5 g/dL (calc) (ref 1.9–3.7)
Glucose, Bld: 97 mg/dL (ref 65–99)
Potassium: 4.7 mmol/L (ref 3.5–5.3)
Sodium: 141 mmol/L (ref 135–146)
Total Bilirubin: 0.3 mg/dL (ref 0.2–1.2)
Total Protein: 6.7 g/dL (ref 6.1–8.1)
eGFR: 81 mL/min/{1.73_m2} (ref >=60)

## 2021-04-21 LAB — LIPID PANEL
Cholesterol: 188 mg/dL (ref <200)
HDL: 43 mg/dL (ref >=40)
LDL Cholesterol (Calc): 113 mg/dL (calc) — ABNORMAL HIGH
Non-HDL Cholesterol (Calc): 145 mg/dL (calc) — ABNORMAL HIGH (ref <130)
Total CHOL/HDL Ratio: 4.4 (calc) (ref <5.0)
Triglycerides: 203 mg/dL — ABNORMAL HIGH (ref <150)

## 2021-04-21 LAB — PSA: PSA: 0.82 ng/mL (ref <=4.00)

## 2021-04-22 NOTE — Telephone Encounter (Signed)
Please advice on pt's B/P readings ?

## 2021-04-27 ENCOUNTER — Telehealth: Payer: Self-pay

## 2021-04-27 ENCOUNTER — Encounter: Payer: Self-pay | Admitting: Family Medicine

## 2021-04-28 NOTE — Telephone Encounter (Signed)
Left msg to pt's wife to have pt call back 04/28/21 ?

## 2021-04-29 MED ORDER — AMLODIPINE BESYLATE 10 MG PO TABS: 10.0000 mg | ORAL_TABLET | Freq: Every day | ORAL | 0 refills | Status: DC

## 2021-04-29 NOTE — Telephone Encounter (Signed)
Pt called,  ok to take amlodipine per order. Rx placed ?

## 2021-04-29 NOTE — Addendum Note (Signed)
Addended by: Colman Cater on: 04/29/2021 04:33 PM ? ? Modules accepted: Orders ? ?

## 2021-04-30 NOTE — Telephone Encounter (Signed)
/  Spoke with pt on 04/29/21, pt is aware and has agreed to take the Amlodipine. RX sent to pharmacy.  ? ?Pt per the best way to get in touch with him is through [place].  ?

## 2021-05-20 ENCOUNTER — Other Ambulatory Visit: Payer: Self-pay | Admitting: Family Medicine

## 2021-05-20 DIAGNOSIS — E785 Hyperlipidemia, unspecified: Secondary | ICD-10-CM

## 2021-05-25 ENCOUNTER — Encounter: Payer: Self-pay | Admitting: Family Medicine

## 2021-06-18 ENCOUNTER — Other Ambulatory Visit: Payer: Self-pay | Admitting: Family Medicine

## 2021-06-18 DIAGNOSIS — E785 Hyperlipidemia, unspecified: Secondary | ICD-10-CM

## 2021-06-18 DIAGNOSIS — K219 Gastro-esophageal reflux disease without esophagitis: Secondary | ICD-10-CM

## 2021-06-18 NOTE — Telephone Encounter (Signed)
Requested medication (s) are due for refill today: expired medication  Requested medication (s) are on the active medication list: yes  Last refill:  protonix- 04/14/20 #180 3 refills, lipitor- 05/20/21 #30 0 refills  Future visit scheduled: no  Notes to clinic:  expired medication . Do you want to renew Rx ? Do you want to order additional refills for lipitor ?     Requested Prescriptions  Pending Prescriptions Disp Refills   pantoprazole (PROTONIX) 40 MG tablet [Pharmacy Med Name: PANTOPRAZOLE SODIUM 40 MG DR TAB] 180 tablet 3    Sig: TAKE 1 TABLET BY MOUTH TWICE (2) DAILY     Gastroenterology: Proton Pump Inhibitors Failed - 06/18/2021  9:43 AM      Failed - Valid encounter within last 12 months    Recent Outpatient Visits           1 month ago General medical exam   [place] Susy Frizzle, MD   1 year ago Left sided sciatica   [place] Susy Frizzle, MD   2 years ago Acute pain of left knee   [place] Susy Frizzle, MD   2 years ago Gastroesophageal reflux disease, esophagitis presence not specified   [employer] Susy Frizzle, MD   3 years ago Cellulitis and abscess of neck   [place] Delsa Grana, PA-C                atorvastatin (LIPITOR) 80 MG tablet [Pharmacy Med Name: ATORVASTATIN CALCIUM 80 MG TAB] 30 tablet 0    Sig: TAKE 1 TABLET BY MOUTH EVERY NIGHT AT BEDTIME (NEED OFFICE VISIT FOR REFILLS)     Cardiovascular:  Antilipid - Statins Failed - 06/18/2021  9:43 AM      Failed - Valid encounter within last 12 months    Recent Outpatient Visits           1 month ago General medical exam   [place] Susy Frizzle, MD   1 year ago Left sided sciatica   [place] Susy Frizzle, MD   2 years ago Acute pain of left knee   [place] Susy Frizzle, MD   2 years ago Gastroesophageal  reflux disease, esophagitis presence not specified   [place] Susy Frizzle, MD   3 years ago Cellulitis and abscess of neck   [place] Delsa Grana, PA-C               Failed - Lipid Panel in normal range within the last 12 months    Cholesterol  Date Value Ref Range Status  04/20/2021 188 <200 mg/dL Final   LDL Cholesterol (Calc)  Date Value Ref Range Status  04/20/2021 113 (H) mg/dL (calc) Final    Comment:    Reference range: <100 . Desirable range <100 mg/dL for primary prevention;   <70 mg/dL for patients with CHD or diabetic patients  with > or = 2 CHD risk factors. Marland Kitchen LDL-C is now calculated using the Martin-Hopkins  calculation, which is a validated novel method providing  better accuracy than the Friedewald equation in the  estimation of LDL-C.  Cresenciano Genre et al. Annamaria Helling. 2536;644(03): 2061-2068  (http://education.[facility].com/faq/FAQ164)    HDL  Date Value Ref Range Status  04/20/2021 43 > OR = 40 mg/dL Final   Triglycerides  Date Value Ref Range Status  04/20/2021 203 (H) <150 mg/dL Final    Comment:    . If a non-fasting specimen was collected, consider repeat triglyceride testing on a fasting specimen if clinically indicated.  Yates Decamp et al. J. of Clin. Lipidol. [id number];7:[id number]. Marland Kitchen          Passed - Patient is not pregnant

## 2021-06-22 ENCOUNTER — Encounter: Payer: Self-pay | Admitting: Family Medicine

## 2021-06-30 ENCOUNTER — Ambulatory Visit: Payer: Managed Care, Other (non HMO) | Admitting: Family Medicine

## 2021-06-30 VITALS — BP 138/82 | HR 79 | Temp 97.4°F | Ht 70.0 in | Wt 306.8 lb

## 2021-06-30 DIAGNOSIS — G4733 Obstructive sleep apnea (adult) (pediatric): Secondary | ICD-10-CM | POA: Diagnosis not present

## 2021-06-30 MED ORDER — LOSARTAN POTASSIUM 100 MG PO TABS: 100.0000 mg | ORAL_TABLET | Freq: Every day | ORAL | 3 refills | Status: DC

## 2021-06-30 NOTE — Progress Notes (Signed)
Subjective:    Patient ID: Jorge Bowman, male    DOB: [birth year], 56 y.o.   MRN: [id number]  Please see my last physical exam for this patient.  He presents today requesting referral for sleep study.  At his last physical exam, I noticed that he has a very low hanging palate and a large uvula it essentially blocks his entire oropharynx.  He does snore.  He reports feeling extremely tired and lethargic every day.  His blood pressure has been highly variable.  His BMI is 44.  All of these are concerning signs and features of possible sleep apnea.  He also drives a truck.  Therefore he would like to get scheduled for sleep study.  He states that his wife has mentioned she has heard him stop breathing at times.  However he is unable to tolerate amlodipine.  The medicine causes his legs to swell and he feels like it is causing weight gain.  He stopped lisinopril due to a cough  Past Medical History:  Diagnosis Date   DDD (degenerative disc disease), cervical    DDD (degenerative disc disease), lumbar    Family history of premature coronary artery disease 10/09/2012   Hyperlipidemia    Hypertension    Obesity    No past surgical history on file. Current Outpatient Medications on File Prior to Visit  Medication Sig Dispense Refill   amLODipine (NORVASC) 10 MG tablet Take 1 tablet (10 mg total) by mouth daily. 90 tablet 0   atorvastatin (LIPITOR) 80 MG tablet TAKE 1 TABLET BY MOUTH EVERY NIGHT AT BEDTIME (NEED OFFICE VISIT FOR REFILLS) 90 tablet 1   Multiple Vitamin (MULTIVITAMIN) tablet Take 1 tablet by mouth daily.     pantoprazole (PROTONIX) 40 MG tablet TAKE 1 TABLET BY MOUTH TWICE (2) DAILY 180 tablet 3   sildenafil (VIAGRA) 100 MG tablet Take 0.5-1 tablets (50-100 mg total) by mouth daily as needed for erectile dysfunction. 5 tablet 11   sucralfate (CARAFATE) 1 g tablet TAKE 1 TABLET BY MOUTH 4 TIMES A DAY WITH MEALS AND AT BEDTIME. 120 tablet 3   No current facility-administered medications  on file prior to visit.   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   Social Determinants of Health   Financial Resource Strain: Not on file  Food Insecurity: Not on file  Transportation Needs: Not on file  Physical Activity: Not on file  Stress: Not on file  Social Connections: Not on file  Intimate Partner Violence: Not on file   Family History  Problem Relation Age of Onset   Heart disease Father 70       stent      Review of Systems  All other systems reviewed and are negative.  Objective:   Physical Exam Vitals reviewed.  Constitutional:      General: He is not in acute distress.    Appearance: He is well-developed. He is obese. He is not ill-appearing, toxic-appearing or diaphoretic.  HENT:     Head: Normocephalic and atraumatic.     Right Ear: Tympanic membrane and ear canal normal.     Left Ear: Tympanic membrane and ear canal normal.     Nose: Nose normal. No congestion.     Mouth/Throat:     Mouth: Mucous membranes are moist.     Pharynx: Oropharynx is clear. No posterior oropharyngeal erythema.  Eyes:     Extraocular Movements: Extraocular movements intact.     Conjunctiva/sclera: Conjunctivae normal.     Pupils: Pupils are equal, round, and reactive to light.  Neck:     Thyroid: No thyromegaly.     Vascular: No carotid bruit or JVD.  Cardiovascular:     Rate and Rhythm:  Normal rate and regular rhythm.     Heart sounds: Normal heart sounds. No murmur heard.   No friction rub. No gallop.  Pulmonary:     Effort: Pulmonary effort is normal. No respiratory distress.     Breath sounds: Normal breath sounds. No stridor. No wheezing or rales.  Chest:     Chest wall: No tenderness.  Abdominal:     General: Bowel sounds are normal. There is no distension.     Palpations: Abdomen is soft. There is no mass.     Tenderness: There is no abdominal tenderness. There is no guarding or rebound.  Musculoskeletal:        General: No swelling or deformity.     Cervical back: Normal range of motion and neck supple. No rigidity.     Right lower leg: No edema.     Left lower leg: No edema.  Lymphadenopathy:     Cervical: No cervical adenopathy.  Skin:    General: Skin is warm.     Coloration: Skin is not jaundiced or pale.     Findings: No bruising, erythema, lesion or rash.  Neurological:     General: No focal deficit present.     Mental Status: He is alert and oriented to person, place, and time. Mental status is at baseline.     Cranial Nerves: No cranial nerve deficit.     Sensory: No sensory deficit.     Motor: No weakness.     Coordination: Coordination normal.     Gait: Gait normal.     Deep Tendon Reflexes: Reflexes normal.          Assessment & Plan:  Obstructive sleep apnea syndrome - Plan: Ambulatory referral to Sleep Studies Discontinue amlodipine and replace with losartan 100 mg daily.  Recheck blood pressure in 1 month.  Schedule the patient for a sleep study given his physical exam findings and his symptoms.

## 2021-07-07 ENCOUNTER — Encounter: Payer: Self-pay | Admitting: Family Medicine

## 2021-07-14 ENCOUNTER — Encounter: Payer: Self-pay | Admitting: Family Medicine

## 2021-07-15 ENCOUNTER — Telehealth: Payer: Self-pay

## 2021-07-15 ENCOUNTER — Other Ambulatory Visit: Payer: Self-pay

## 2021-07-15 DIAGNOSIS — G4733 Obstructive sleep apnea (adult) (pediatric): Secondary | ICD-10-CM

## 2021-07-15 NOTE — Telephone Encounter (Signed)
Spoke with Eddie Dibbles at [facility] [phone]  Requesting for sleep study typ to be change to in lab sleep study

## 2021-07-16 NOTE — Telephone Encounter (Signed)
07/15/21 referrals were update to have sleep study to be done in lab.   Receive fax from [facility] to complete and correct order in lab sleep study to be done for pt. Form fax back

## 2021-07-20 ENCOUNTER — Encounter: Payer: Self-pay | Admitting: Family Medicine

## 2021-07-22 NOTE — Telephone Encounter (Signed)
07/21/21 per referrals stated that [facility] still needed more info re study. Told referral that we did fill out a form per [facility] and faxed it to them already per their request.   Will check with [facility]

## 2021-07-22 NOTE — Telephone Encounter (Signed)
Spoke with [place] this morning, they needed to what type of in lab sleep study pt needed done?  Please advice  Fax [phone]

## 2021-10-06 ENCOUNTER — Ambulatory Visit: Payer: Managed Care, Other (non HMO) | Admitting: Family Medicine

## 2021-11-16 ENCOUNTER — Encounter: Payer: Self-pay | Admitting: Family Medicine

## 2021-11-17 ENCOUNTER — Other Ambulatory Visit: Payer: Self-pay

## 2021-11-17 DIAGNOSIS — F17200 Nicotine dependence, unspecified, uncomplicated: Secondary | ICD-10-CM

## 2021-11-17 DIAGNOSIS — Z716 Tobacco abuse counseling: Secondary | ICD-10-CM

## 2021-11-17 MED ORDER — BUPROPION HCL ER (XL) 150 MG PO TB24: 150.0000 mg | ORAL_TABLET | Freq: Every day | ORAL | 3 refills | Status: DC

## 2021-12-15 ENCOUNTER — Other Ambulatory Visit: Payer: Self-pay | Admitting: Family Medicine

## 2021-12-15 DIAGNOSIS — E785 Hyperlipidemia, unspecified: Secondary | ICD-10-CM

## 2021-12-15 NOTE — Telephone Encounter (Signed)
Per visit 04/20/21- no medication changes for lipids Requested Prescriptions  Pending Prescriptions Disp Refills   atorvastatin (LIPITOR) 80 MG tablet [Pharmacy Med Name: ATORVASTATIN CALCIUM 80 MG TAB] 90 tablet 1    Sig: Take 1 tablet (80 mg total) by mouth at bedtime.     Cardiovascular:  Antilipid - Statins Failed - 12/15/2021 11:43 AM      Failed - Valid encounter within last 12 months    Recent Outpatient Visits           5 months ago Obstructive sleep apnea syndrome   [place] Pickard, Cammie Mcgee, MD   7 months ago General medical exam   [place] Susy Frizzle, MD   2 years ago Left sided sciatica   [place] Susy Frizzle, MD   2 years ago Acute pain of left knee   [place] Susy Frizzle, MD   3 years ago Gastroesophageal reflux disease, esophagitis presence not specified   [place] Pickard, Cammie Mcgee, MD              Failed - Lipid Panel in normal range within the last 12 months    Cholesterol  Date Value Ref Range Status  04/20/2021 188 <200 mg/dL Final   LDL Cholesterol (Calc)  Date Value Ref Range Status  04/20/2021 113 (H) mg/dL (calc) Final    Comment:    Reference range: <100 . Desirable range <100 mg/dL for primary prevention;   <70 mg/dL for patients with CHD or diabetic patients  with > or = 2 CHD risk factors. Marland Kitchen LDL-C is now calculated using the Martin-Hopkins  calculation, which is a validated novel method providing  better accuracy than the Friedewald equation in the  estimation of LDL-C.  Cresenciano Genre et al. Annamaria Helling. 5364;680(32): 2061-2068  (http://education.[facility].com/faq/FAQ164)    HDL  Date Value Ref Range Status  04/20/2021 43 > OR = 40 mg/dL Final   Triglycerides  Date Value Ref Range Status  04/20/2021 203 (H) <150 mg/dL Final    Comment:    . If a non-fasting specimen was collected, consider repeat triglyceride  testing on a fasting specimen if clinically indicated.  Yates Decamp et al. J. of Clin. Lipidol. 1224;8:250-037. Marland Kitchen          Passed - Patient is not pregnant

## 2021-12-31 ENCOUNTER — Encounter: Payer: Self-pay | Admitting: Family Medicine

## 2021-12-31 ENCOUNTER — Ambulatory Visit: Payer: Managed Care, Other (non HMO) | Admitting: Family Medicine

## 2021-12-31 VITALS — BP 140/80 | HR 76 | Temp 98.8°F | Ht 70.0 in | Wt 304.0 lb

## 2021-12-31 DIAGNOSIS — J069 Acute upper respiratory infection, unspecified: Secondary | ICD-10-CM | POA: Insufficient documentation

## 2021-12-31 LAB — INFLUENZA A AND B AG, IMMUNOASSAY
INFLUENZA A ANTIGEN: NOT DETECTED
INFLUENZA B ANTIGEN: NOT DETECTED

## 2021-12-31 NOTE — Progress Notes (Signed)
Acute Office Visit  Subjective:     Patient ID: Jorge Bowman, male    DOB: [birth year], 56 y.o.   MRN: [id number]  Chief Complaint  Patient presents with   Follow-up    possible sinus infection    HPI Patient is in today for 3 days of chills, sinus pressure, clear rhinorrhea, headache, cough. Denies fever, ear pain and pressure, shortness of breath, wheezing, sore throat He was around his pastor's wife who tested positive for covid.   Review of Systems  Reason unable to perform ROS: see HPI.  All other systems reviewed and are negative.       Objective:    BP (!) 140/80   Pulse 76   Temp 98.8 F (37.1 C) (Oral)   Ht '5\' 10"'$  (1.778 m)   Wt (!) 304 lb (137.9 kg)   SpO2 95%   BMI 43.62 kg/m    Physical Exam Vitals and nursing note reviewed.  Constitutional:      Appearance: Normal appearance. He is normal weight.  HENT:     Head: Normocephalic and atraumatic.     Right Ear: Tympanic membrane, ear canal and external ear normal.     Left Ear: Tympanic membrane, ear canal and external ear normal.     Nose: Nose normal.     Mouth/Throat:     Mouth: Mucous membranes are moist.     Pharynx: Oropharynx is clear.  Eyes:     Extraocular Movements: Extraocular movements intact.     Conjunctiva/sclera: Conjunctivae normal.  Cardiovascular:     Rate and Rhythm: Normal rate and regular rhythm.     Pulses: Normal pulses.     Heart sounds: Normal heart sounds.  Pulmonary:     Effort: Pulmonary effort is normal.     Breath sounds: Normal breath sounds.  Lymphadenopathy:     Cervical: No cervical adenopathy.  Skin:    General: Skin is warm and dry.     Capillary Refill: Capillary refill takes less than 2 seconds.  Neurological:     General: No focal deficit present.     Mental Status: He is alert and oriented to person, place, and time. Mental status is at baseline.  Psychiatric:        Mood and Affect: Mood normal.        Behavior: Behavior normal.        Thought  Content: Thought content normal.        Judgment: Judgment normal.     No results found for any visits on 12/31/21.      Assessment & Plan:   Problem List Items Addressed This Visit       Respiratory   Viral URI with cough - Primary    Symptoms consistent with early viral illness. Flu negative, patient will take an at home Covid test and call back if positive for antiviral. Reassured patient that symptoms and exam findings are most consistent with a viral upper respiratory infection and explained lack of efficacy of antibiotics against viruses.  Discussed expected course and features suggestive of secondary bacterial infection.  Continue supportive care. Increase fluid intake with water or electrolyte solution like pedialyte. Encouraged acetaminophen as needed for fever/pain. Encouraged salt water gargling, chloraseptic spray and throat lozenges. Encouraged OTC guaifenesin. Encouraged saline sinus flushes and/or neti with humidified air.       Relevant Orders   Influenza A and B Ag, Immunoassay    No orders of the defined types were placed in  this encounter.   Return if symptoms worsen or fail to improve.  Rubie Maid, FNP

## 2021-12-31 NOTE — Assessment & Plan Note (Signed)
Symptoms consistent with early viral illness. Flu negative, patient will take an at home Covid test and call back if positive for antiviral. Reassured patient that symptoms and exam findings are most consistent with a viral upper respiratory infection and explained lack of efficacy of antibiotics against viruses.  Discussed expected course and features suggestive of secondary bacterial infection.  Continue supportive care. Increase fluid intake with water or electrolyte solution like pedialyte. Encouraged acetaminophen as needed for fever/pain. Encouraged salt water gargling, chloraseptic spray and throat lozenges. Encouraged OTC guaifenesin. Encouraged saline sinus flushes and/or neti with humidified air.

## 2022-07-12 ENCOUNTER — Other Ambulatory Visit: Payer: Managed Care, Other (non HMO)

## 2022-07-12 DIAGNOSIS — Z8249 Family history of ischemic heart disease and other diseases of the circulatory system: Secondary | ICD-10-CM

## 2022-07-12 DIAGNOSIS — Z125 Encounter for screening for malignant neoplasm of prostate: Secondary | ICD-10-CM

## 2022-07-12 DIAGNOSIS — I10 Essential (primary) hypertension: Secondary | ICD-10-CM

## 2022-07-12 DIAGNOSIS — E782 Mixed hyperlipidemia: Secondary | ICD-10-CM

## 2022-07-13 LAB — COMPLETE METABOLIC PANEL WITH GFR
AG Ratio: 1.5 (calc) (ref 1.0–2.5)
ALT: 13 U/L (ref 9–46)
AST: 13 U/L (ref 10–35)
Albumin: 3.8 g/dL (ref 3.6–5.1)
Alkaline phosphatase (APISO): 106 U/L (ref 35–144)
BUN: 13 mg/dL (ref 7–25)
CO2: 27 mmol/L (ref 20–32)
Calcium: 8.9 mg/dL (ref 8.6–10.3)
Chloride: 105 mmol/L (ref 98–110)
Creat: 0.87 mg/dL (ref 0.70–1.30)
Globulin: 2.5 g/dL (calc) (ref 1.9–3.7)
Glucose, Bld: 93 mg/dL (ref 65–99)
Potassium: 4.6 mmol/L (ref 3.5–5.3)
Sodium: 142 mmol/L (ref 135–146)
Total Bilirubin: 0.3 mg/dL (ref 0.2–1.2)
Total Protein: 6.3 g/dL (ref 6.1–8.1)
eGFR: 101 mL/min/{1.73_m2} (ref >=60)

## 2022-07-13 LAB — CBC WITH DIFFERENTIAL/PLATELET
Absolute Monocytes: 873 cells/uL (ref 200–950)
Basophils Absolute: 117 cells/uL (ref 0–200)
Basophils Relative: 1.3 %
Eosinophils Absolute: 486 cells/uL (ref 15–500)
Eosinophils Relative: 5.4 %
HCT: 40.7 % (ref 38.5–50.0)
Hemoglobin: 13.4 g/dL (ref 13.2–17.1)
Lymphs Abs: 3150 cells/uL (ref 850–3900)
MCH: 29.8 pg (ref 27.0–33.0)
MCHC: 32.9 g/dL (ref 32.0–36.0)
MCV: 90.6 fL (ref 80.0–100.0)
MPV: 11 fL (ref 7.5–12.5)
Monocytes Relative: 9.7 %
Neutro Abs: 4374 cells/uL (ref 1500–7800)
Neutrophils Relative %: 48.6 %
Platelets: 336 10*3/uL (ref 140–400)
RBC: 4.49 10*6/uL (ref 4.20–5.80)
RDW: 12.4 % (ref 11.0–15.0)
Total Lymphocyte: 35 %
WBC: 9 10*3/uL (ref 3.8–10.8)

## 2022-07-13 LAB — LIPID PANEL
Cholesterol: 166 mg/dL (ref <200)
HDL: 42 mg/dL (ref >=40)
LDL Cholesterol (Calc): 105 mg/dL (calc) — ABNORMAL HIGH
Non-HDL Cholesterol (Calc): 124 mg/dL (calc) (ref <130)
Total CHOL/HDL Ratio: 4 (calc) (ref <5.0)
Triglycerides: 92 mg/dL (ref <150)

## 2022-07-13 LAB — PSA: PSA: 0.37 ng/mL (ref <=4.00)

## 2022-08-23 ENCOUNTER — Encounter: Payer: Self-pay | Admitting: Family Medicine

## 2022-08-23 ENCOUNTER — Other Ambulatory Visit: Payer: Self-pay

## 2022-08-23 DIAGNOSIS — F17200 Nicotine dependence, unspecified, uncomplicated: Secondary | ICD-10-CM

## 2022-08-23 DIAGNOSIS — Z716 Tobacco abuse counseling: Secondary | ICD-10-CM

## 2022-08-23 DIAGNOSIS — E785 Hyperlipidemia, unspecified: Secondary | ICD-10-CM

## 2022-08-23 DIAGNOSIS — K219 Gastro-esophageal reflux disease without esophagitis: Secondary | ICD-10-CM

## 2022-08-23 MED ORDER — PANTOPRAZOLE SODIUM 40 MG PO TBEC: 40.0000 mg | DELAYED_RELEASE_TABLET | Freq: Two times a day (BID) | ORAL | 1 refills | Status: DC

## 2022-08-23 MED ORDER — LOSARTAN POTASSIUM 100 MG PO TABS: 100.0000 mg | ORAL_TABLET | Freq: Every day | ORAL | 1 refills | Status: DC

## 2022-08-23 MED ORDER — ATORVASTATIN CALCIUM 80 MG PO TABS: 80.0000 mg | ORAL_TABLET | Freq: Every day | ORAL | 1 refills | Status: DC

## 2022-08-23 MED ORDER — BUPROPION HCL ER (XL) 150 MG PO TB24: 150.0000 mg | ORAL_TABLET | Freq: Every day | ORAL | 1 refills | Status: DC

## 2022-10-28 ENCOUNTER — Encounter: Payer: Self-pay | Admitting: Family Medicine

## 2022-10-29 MED ORDER — LOSARTAN POTASSIUM 100 MG PO TABS: 100.0000 mg | ORAL_TABLET | Freq: Every day | ORAL | 0 refills | Status: DC

## 2022-11-08 ENCOUNTER — Ambulatory Visit: Payer: Managed Care, Other (non HMO) | Admitting: Family Medicine

## 2022-11-10 ENCOUNTER — Ambulatory Visit: Payer: Managed Care, Other (non HMO) | Admitting: Family Medicine

## 2022-11-22 ENCOUNTER — Encounter: Payer: Self-pay | Admitting: Family Medicine

## 2022-11-22 ENCOUNTER — Ambulatory Visit: Payer: Managed Care, Other (non HMO) | Admitting: Family Medicine

## 2022-11-22 VITALS — BP 140/82 | HR 65 | Temp 98.5°F | Ht 70.0 in | Wt 315.0 lb

## 2022-11-22 DIAGNOSIS — Z1211 Encounter for screening for malignant neoplasm of colon: Secondary | ICD-10-CM | POA: Diagnosis not present

## 2022-11-22 DIAGNOSIS — I10 Essential (primary) hypertension: Secondary | ICD-10-CM | POA: Diagnosis not present

## 2022-11-22 DIAGNOSIS — E785 Hyperlipidemia, unspecified: Secondary | ICD-10-CM | POA: Diagnosis not present

## 2022-11-22 NOTE — Progress Notes (Signed)
Subjective:    Patient ID: Jorge Bowman, male    DOB: 1965-11-12, 57 y.o.   MRN: [id number]  Patient is here today for a follow-up of his blood pressure and his cholesterol.  His blood pressure today is borderline however he is monitoring this at home and states that it looks to be 30s over 80s.  He denies any chest pain or shortness of breath.  His BMI is significantly elevated however he states that he is walking 2 miles every day.  I have suggested that he reduce his caloric intake to help facilitate weight loss.  He declines a flu shot today.  He denies any myalgias or right upper quadrant pain on his statin.  His most recent lab work is listed below No visits with results within 4 Month(s) from this visit.  Latest known visit with results is:  Lab on 07/12/2022  Component Date Value Ref Range Status   WBC 07/12/2022 9.0  3.8 - 10.8 Thousand/uL Final   RBC 07/12/2022 4.49  4.20 - 5.80 Million/uL Final   Hemoglobin 07/12/2022 13.4  13.2 - 17.1 g/dL Final   HCT 78/46/9629 40.7  38.5 - 50.0 % Final   MCV 07/12/2022 90.6  80.0 - 100.0 fL Final   MCH 07/12/2022 29.8  27.0 - 33.0 pg Final   MCHC 07/12/2022 32.9  32.0 - 36.0 g/dL Final   RDW 52/84/1324 12.4  11.0 - 15.0 % Final   Platelets 07/12/2022 336  140 - 400 Thousand/uL Final   MPV 07/12/2022 11.0  7.5 - 12.5 fL Final   Neutro Abs 07/12/2022 4,374  1,500 - 7,800 cells/uL Final   Lymphs Abs 07/12/2022 3,150  850 - 3,900 cells/uL Final   Absolute Monocytes 07/12/2022 873  200 - 950 cells/uL Final   Eosinophils Absolute 07/12/2022 486  15 - 500 cells/uL Final   Basophils Absolute 07/12/2022 117  0 - 200 cells/uL Final   Neutrophils Relative % 07/12/2022 48.6  % Final   Total Lymphocyte 07/12/2022 35.0  % Final   Monocytes Relative 07/12/2022 9.7  % Final   Eosinophils Relative 07/12/2022 5.4  % Final   Basophils Relative 07/12/2022 1.3  % Final   Glucose, Bld 07/12/2022 93  65 - 99 mg/dL Final   Comment: .            Fasting  reference interval .    BUN 07/12/2022 13  7 - 25 mg/dL Final   Creat 40/11/2723 0.87  0.70 - 1.30 mg/dL Final   eGFR 36/64/4034 101  > OR = 60 mL/min/1.73m2 Final   BUN/Creatinine Ratio 07/12/2022 SEE NOTE:  6 - 22 (calc) Final   Comment:    Not Reported: BUN and Creatinine are within    reference range. .    Sodium 07/12/2022 142  135 - 146 mmol/L Final   Potassium 07/12/2022 4.6  3.5 - 5.3 mmol/L Final   Chloride 07/12/2022 105  98 - 110 mmol/L Final   CO2 07/12/2022 27  20 - 32 mmol/L Final   Calcium 07/12/2022 8.9  8.6 - 10.3 mg/dL Final   Total Protein 74/25/9563 6.3  6.1 - 8.1 g/dL Final   Albumin 87/56/4332 3.8  3.6 - 5.1 g/dL Final   Globulin 95/18/8416 2.5  1.9 - 3.7 g/dL (calc) Final   AG Ratio 07/12/2022 1.5  1.0 - 2.5 (calc) Final   Total Bilirubin 07/12/2022 0.3  0.2 - 1.2 mg/dL Final   Alkaline phosphatase (APISO) 07/12/2022 106  35 - 144  U/L Final   AST 07/12/2022 13  10 - 35 U/L Final   ALT 07/12/2022 13  9 - 46 U/L Final   Cholesterol 07/12/2022 166  <200 mg/dL Final   HDL 56/21/3086 42  > OR = 40 mg/dL Final   Triglycerides 57/84/6962 92  <150 mg/dL Final   LDL Cholesterol (Calc) 07/12/2022 105 (H)  mg/dL (calc) Final   Comment: Reference range: <100 . Desirable range <100 mg/dL for primary prevention;   <70 mg/dL for patients with CHD or diabetic patients  with > or = 2 CHD risk factors. Marland Kitchen LDL-C is now calculated using the Martin-Hopkins  calculation, which is a validated novel method providing  better accuracy than the Friedewald equation in the  estimation of LDL-C.  Horald Pollen et al. Lenox Ahr. 9528;413(24): 2061-2068  (http://education.[facility].com/faq/FAQ164)    Total CHOL/HDL Ratio 07/12/2022 4.0  <4.0 (calc) Final   Non-HDL Cholesterol (Calc) 07/12/2022 124  <130 mg/dL (calc) Final   Comment: For patients with diabetes plus 1 major ASCVD risk  factor, treating to a non-HDL-C goal of <100 mg/dL  (LDL-C of <10 mg/dL) is considered a therapeutic   option.    PSA 07/12/2022 0.37  < OR = 4.00 ng/mL Final   Comment: The total PSA value from this assay system is  standardized against the WHO standard. The test  result will be approximately 20% lower when compared  to the equimolar-standardized total PSA (Beckman  Coulter). Comparison of serial PSA results should be  interpreted with this fact in mind. . This test was performed using the Siemens  chemiluminescent method. Values obtained from  different assay methods cannot be used interchangeably. PSA levels, regardless of value, should not be interpreted as absolute evidence of the presence or absence of disease.     Past Medical History:  Diagnosis Date   DDD (degenerative disc disease), cervical    DDD (degenerative disc disease), lumbar    Family history of premature coronary artery disease 10/09/2012   Hyperlipidemia    Hypertension    Obesity    No past surgical history on file. Current Outpatient Medications on File Prior to Visit  Medication Sig Dispense Refill   atorvastatin (LIPITOR) 80 MG tablet Take 1 tablet (80 mg total) by mouth at bedtime. 30 tablet 1   losartan (COZAAR) 100 MG tablet Take 1 tablet (100 mg total) by mouth daily. 30 tablet 0   Multiple Vitamin (MULTIVITAMIN) tablet Take 1 tablet by mouth daily.     buPROPion (WELLBUTRIN XL) 150 MG 24 hr tablet Take 1 tablet (150 mg total) by mouth daily. (Patient not taking: Reported on 11/22/2022) 30 tablet 1   pantoprazole (PROTONIX) 40 MG tablet Take 1 tablet (40 mg total) by mouth 2 (two) times daily. (Patient not taking: Reported on 11/22/2022) 30 tablet 1   sildenafil (VIAGRA) 100 MG tablet Take 0.5-1 tablets (50-100 mg total) by mouth daily as needed for erectile dysfunction. (Patient not taking: Reported on 11/22/2022) 5 tablet 11   sucralfate (CARAFATE) 1 g tablet TAKE 1 TABLET BY MOUTH 4 TIMES A DAY WITH MEALS AND AT BEDTIME. (Patient not taking: Reported on 11/22/2022) 120 tablet 3   No current  facility-administered medications on file prior to visit.   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   Social Determinants of Health   Financial Resource Strain: Low Risk  (11/22/2022)   Overall Financial Resource Strain (CARDIA)    Difficulty of Paying Living Expenses: Not hard at all  Food Insecurity: No Food Insecurity (11/22/2022)   Hunger Vital Sign    Worried About Running Out of Food in the Last Year: Never true    Ran Out of Food in the Last Year: Never true  Transportation Needs: No Transportation Needs (11/22/2022)   PRAPARE - Administrator, Civil Service (Medical): No    Lack of Transportation (Non-Medical): No  Physical Activity: Sufficiently Active (11/22/2022)   Exercise Vital Sign    Days of Exercise per Week: 5 days    Minutes of Exercise per Session: 50 min  Stress: No Stress Concern Present (11/22/2022)   [employer] of Occupational Health - Occupational Stress Questionnaire    Feeling of Stress : Not at all  Social Connections: Moderately Integrated (11/22/2022)   Social Connection and Isolation Panel [NHANES]    Frequency of Communication with Friends and Family: More than three times a week    Frequency of Social Gatherings with Friends and Family: More than three times a week    Attends Religious Services: More than 4 times per year    Active Member of [employer] or  Organizations: No    Attends Engineer, structural: Not on file    Marital Status: Married  Catering manager Violence: Not on file   Family History  Problem Relation Age of Onset   Heart disease Father 50       stent      Review of Systems  All other systems reviewed and are negative.      Objective:   Physical Exam Vitals reviewed.  Constitutional:      General: He is not in acute distress.    Appearance: He is well-developed. He is obese. He is not ill-appearing, toxic-appearing or diaphoretic.  HENT:     Head: Normocephalic and atraumatic.     Right Ear: Tympanic membrane and ear canal normal.     Left Ear: Tympanic membrane and ear canal normal.     Nose: Nose normal. No congestion.     Mouth/Throat:     Mouth: Mucous membranes are moist.     Pharynx: Oropharynx is clear. No posterior oropharyngeal erythema.  Eyes:     Extraocular Movements: Extraocular movements intact.     Conjunctiva/sclera: Conjunctivae normal.     Pupils: Pupils are equal, round, and reactive to light.  Neck:     Thyroid: No thyromegaly.     Vascular: No carotid bruit or JVD.  Cardiovascular:     Rate and Rhythm: Normal rate and regular rhythm.     Heart sounds: Normal heart sounds. No murmur heard.    No friction rub. No gallop.  Pulmonary:     Effort: Pulmonary effort is normal. No respiratory distress.     Breath  sounds: Normal breath sounds. No stridor. No wheezing or rales.  Chest:     Chest wall: No tenderness.  Abdominal:     General: Bowel sounds are normal. There is no distension.     Palpations: Abdomen is soft. There is no mass.     Tenderness: There is no abdominal tenderness. There is no guarding or rebound.  Musculoskeletal:        General: No swelling or deformity.     Cervical back: Normal range of motion and neck supple. No rigidity.     Right lower leg: No edema.     Left lower leg: No edema.  Lymphadenopathy:     Cervical: No cervical adenopathy.  Skin:     General: Skin is warm.     Coloration: Skin is not jaundiced or pale.     Findings: No bruising, erythema, lesion or rash.  Neurological:     General: No focal deficit present.     Mental Status: He is alert and oriented to person, place, and time. Mental status is at baseline.     Cranial Nerves: No cranial nerve deficit.     Sensory: No sensory deficit.     Motor: No weakness.     Coordination: Coordination normal.     Gait: Gait normal.     Deep Tendon Reflexes: Reflexes normal.           Assessment & Plan:  Colon cancer screening - Plan: Ambulatory referral to Gastroenterology  Hyperlipidemia, unspecified hyperlipidemia type  Benign essential HTN Cholesterol today is borderline.  Because smoking I recommend a coronary artery disease score to risk stratify the patient and determine if we should be more aggressive in lowering his LDL cholesterol.  He declines this test.  His blood pressure today is elevated.  However he states that his home blood pressure is much better controlled.  Patient is overdue for colonoscopy.  I will schedule him for this.

## 2022-11-22 NOTE — Addendum Note (Signed)
Addended by: Lynnea Ferrier T on: 11/22/2022 02:59 PM   Modules accepted: Orders

## 2022-11-29 ENCOUNTER — Other Ambulatory Visit: Payer: Self-pay | Admitting: Family Medicine

## 2022-11-29 ENCOUNTER — Encounter: Payer: Self-pay | Admitting: Family Medicine

## 2022-11-29 ENCOUNTER — Other Ambulatory Visit: Payer: Self-pay

## 2022-11-29 DIAGNOSIS — I10 Essential (primary) hypertension: Secondary | ICD-10-CM

## 2022-11-29 DIAGNOSIS — E785 Hyperlipidemia, unspecified: Secondary | ICD-10-CM

## 2022-11-29 MED ORDER — LOSARTAN POTASSIUM 100 MG PO TABS: 100.0000 mg | ORAL_TABLET | Freq: Every day | ORAL | 1 refills | Status: DC

## 2022-11-29 MED ORDER — ATORVASTATIN CALCIUM 80 MG PO TABS: 80.0000 mg | ORAL_TABLET | Freq: Every day | ORAL | 1 refills | Status: DC

## 2022-11-30 NOTE — Telephone Encounter (Signed)
Reordered 11/29/22 #90 1 RF  Requested Prescriptions  Refused Prescriptions Disp Refills   losartan (COZAAR) 100 MG tablet [Pharmacy Med Name: LOSARTAN POTASSIUM 100MG  TABLET] 30 tablet 0    Sig: TAKE ONE TABLET (100 MG TOTAL) BY MOUTH DAILY.     Cardiovascular:  Angiotensin Receptor Blockers Failed - 11/29/2022  9:22 AM      Failed - Last BP in normal range    BP Readings from Last 1 Encounters:  11/22/22 (!) 140/82         Failed - Valid encounter within last 6 months    Recent Outpatient Visits           1 year ago Obstructive sleep apnea syndrome   [facility] Tanya Nones, Priscille Heidelberg, MD   1 year ago General medical exam   [facility] Donita Brooks, MD   3 years ago Left sided sciatica   [facility] Donita Brooks, MD   3 years ago Acute pain of left knee   [facility] Donita Brooks, MD   4 years ago Gastroesophageal reflux disease, esophagitis presence not specified   [facility] Pickard, Priscille Heidelberg, MD              Passed - Cr in normal range and within 180 days    Creat  Date Value Ref Range Status  07/12/2022 0.87 0.70 - 1.30 mg/dL Final         Passed - K in normal range and within 180 days    Potassium  Date Value Ref Range Status  07/12/2022 4.6 3.5 - 5.3 mmol/L Final         Passed - Patient is not pregnant

## 2023-05-30 ENCOUNTER — Other Ambulatory Visit: Payer: Self-pay | Admitting: Family Medicine

## 2023-05-30 DIAGNOSIS — I10 Essential (primary) hypertension: Secondary | ICD-10-CM

## 2023-07-23 ENCOUNTER — Other Ambulatory Visit: Payer: Self-pay

## 2023-07-23 ENCOUNTER — Emergency Department (HOSPITAL_COMMUNITY)

## 2023-07-23 ENCOUNTER — Emergency Department (HOSPITAL_COMMUNITY)
Admission: EM | Admit: 2023-07-23 | Discharge: 2023-07-23 | Disposition: A | Discharge status: Home / Self Care | Attending: Emergency Medicine | Admitting: Emergency Medicine

## 2023-07-23 ENCOUNTER — Encounter (HOSPITAL_COMMUNITY): Payer: Self-pay

## 2023-07-23 DIAGNOSIS — Z79899 Other long term (current) drug therapy: Secondary | ICD-10-CM | POA: Diagnosis not present

## 2023-07-23 DIAGNOSIS — R0789 Other chest pain: Secondary | ICD-10-CM | POA: Insufficient documentation

## 2023-07-23 DIAGNOSIS — F17200 Nicotine dependence, unspecified, uncomplicated: Secondary | ICD-10-CM | POA: Insufficient documentation

## 2023-07-23 DIAGNOSIS — R600 Localized edema: Secondary | ICD-10-CM | POA: Insufficient documentation

## 2023-07-23 DIAGNOSIS — R0602 Shortness of breath: Secondary | ICD-10-CM | POA: Insufficient documentation

## 2023-07-23 DIAGNOSIS — I10 Essential (primary) hypertension: Secondary | ICD-10-CM | POA: Insufficient documentation

## 2023-07-23 LAB — I-STAT CHEM 8, ED
BUN: 10 mg/dL (ref 6–20)
Calcium, Ion: 1.14 mmol/L — ABNORMAL LOW (ref 1.15–1.40)
Chloride: 100 mmol/L (ref 98–111)
Creatinine, Ser: 1.2 mg/dL (ref 0.61–1.24)
Glucose, Bld: 110 mg/dL — ABNORMAL HIGH (ref 70–99)
HCT: 42 % (ref 39.0–52.0)
Hemoglobin: 14.3 g/dL (ref 13.0–17.0)
Potassium: 4.6 mmol/L (ref 3.5–5.1)
Sodium: 140 mmol/L (ref 135–145)
TCO2: 27 mmol/L (ref 22–32)

## 2023-07-23 LAB — TROPONIN I (HIGH SENSITIVITY)
Troponin I (High Sensitivity): 6 ng/L (ref <18)
Troponin I (High Sensitivity): 6 ng/L (ref <18)

## 2023-07-23 LAB — HEPATIC FUNCTION PANEL
ALT: 26 U/L (ref 0–44)
AST: 27 U/L (ref 15–41)
Albumin: 3.8 g/dL (ref 3.5–5.0)
Alkaline Phosphatase: 91 U/L (ref 38–126)
Bilirubin, Direct: <0.1 mg/dL (ref 0.0–0.2)
Total Bilirubin: 0.5 mg/dL (ref 0.0–1.2)
Total Protein: 7 g/dL (ref 6.5–8.1)

## 2023-07-23 LAB — BASIC METABOLIC PANEL WITH GFR
Anion gap: 9 (ref 5–15)
BUN: 10 mg/dL (ref 6–20)
CO2: 27 mmol/L (ref 22–32)
Calcium: 9.1 mg/dL (ref 8.9–10.3)
Chloride: 102 mmol/L (ref 98–111)
Creatinine, Ser: 1.09 mg/dL (ref 0.61–1.24)
GFR, Estimated: >60 mL/min (ref >60)
Glucose, Bld: 114 mg/dL — ABNORMAL HIGH (ref 70–99)
Potassium: 4.4 mmol/L (ref 3.5–5.1)
Sodium: 138 mmol/L (ref 135–145)

## 2023-07-23 LAB — CBC
HCT: 42.3 % (ref 39.0–52.0)
Hemoglobin: 13.7 g/dL (ref 13.0–17.0)
MCH: 30.6 pg (ref 26.0–34.0)
MCHC: 32.4 g/dL (ref 30.0–36.0)
MCV: 94.6 fL (ref 80.0–100.0)
Platelets: 322 10*3/uL (ref 150–400)
RBC: 4.47 MIL/uL (ref 4.22–5.81)
RDW: 12.4 % (ref 11.5–15.5)
WBC: 8.2 10*3/uL (ref 4.0–10.5)
nRBC: 0 % (ref 0.0–0.2)

## 2023-07-23 LAB — CBG MONITORING, ED: Glucose-Capillary: 110 mg/dL — ABNORMAL HIGH (ref 70–99)

## 2023-07-23 LAB — BRAIN NATRIURETIC PEPTIDE: B Natriuretic Peptide: 5.5 pg/mL (ref 0.0–100.0)

## 2023-07-23 MED ORDER — IOHEXOL 350 MG/ML SOLN
75.0000 mL | Freq: Once | INTRAVENOUS | Status: AC | PRN
  Administered 2023-07-23: 75 mL via INTRAVENOUS

## 2023-07-23 NOTE — ED Notes (Signed)
 Lab to add BNP and hepatic function

## 2023-07-23 NOTE — ED Triage Notes (Signed)
 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  Today reports low oxygen and feeling sob and congested.  Patient does wear cpap.  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

## 2023-07-23 NOTE — ED Provider Notes (Cosign Needed Addendum)
 [name]   CSN: [id number] Arrival date & time: 07/23/23  1458     Patient presents with: Shortness of Breath   Jorge Bowman is a 58 y.o. male. 58 year old male with a past medical history of pretension, hyperlipidemia presents to the ED with a chief complaint of shortness of breath for the past week.  Reports over the past week, he has felt somewhat nasally congested, feels like is hard for him to take a deep breath.  He does wear his CPAP machine at night.  Now, he feels that the shortness of breath is exacerbated with any kind of exertion.  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  He is also noticed his legs to have some swelling to them.  He endorses tobacco use, smoking a pack and a half daily but has decreased this over the past week as he is felt really unwell.  In addition, he endorses some chest pressure starting at the epigastric region radiating upwards to his chest, does feel better whenever he applies any pressure to it with a pillow.  He also endorses a dry cough.  No fever, prior history of MI. Father did have an MI at the age of 62 or 44.   The history is provided by the patient.  Shortness of Breath Severity:  Moderate Onset quality:  Gradual Associated symptoms: chest pain   Associated symptoms: no abdominal pain, no fever and no vomiting        Prior to Admission medications   Medication Sig Start Date End Date Taking? Authorizing Provider  atorvastatin  (LIPITOR) 80 MG tablet Take 1 tablet (80 mg total) by mouth at bedtime. 11/29/22   Duanne Butler DASEN, MD  buPROPion  (WELLBUTRIN  XL) 150 MG 24 hr tablet Take 1 tablet (150 mg total) by mouth daily. Patient not taking: Reported on 11/22/2022 08/23/22   Duanne Butler DASEN, MD  losartan  (COZAAR ) 100 MG tablet TAKE ONE TABLET (100 MG TOTAL) BY MOUTH DAILY. 05/31/23   Duanne Butler DASEN, MD  Multiple  Vitamin (MULTIVITAMIN) tablet Take 1 tablet by mouth daily.    [provider]  pantoprazole  (PROTONIX ) 40 MG tablet Take 1 tablet (40 mg total) by mouth 2 (two) times daily. Patient not taking: Reported on 11/22/2022 08/23/22   Duanne Butler DASEN, MD  sildenafil  (VIAGRA ) 100 MG tablet Take 0.5-1 tablets (50-100 mg total) by mouth daily as needed for erectile dysfunction. Patient not taking: Reported on 11/22/2022 05/28/19   Duanne Butler DASEN, MD  sucralfate  (CARAFATE ) 1 g tablet TAKE 1 TABLET BY MOUTH 4 TIMES A DAY WITH MEALS AND AT BEDTIME. Patient not taking: Reported on 11/22/2022 06/11/19   Duanne Butler DASEN, MD    Allergies: Patient has no known allergies.    Review of Systems  Constitutional:  Negative for chills and fever.  Respiratory:  Positive for shortness of breath.   Cardiovascular:  Positive for chest pain and leg swelling. Negative for palpitations.  Gastrointestinal:  Positive for nausea. Negative for abdominal pain and vomiting.  Genitourinary:  Negative for flank pain.  Musculoskeletal:  Negative for back pain.  All other systems reviewed and are negative.   Updated Vital Signs BP (!) 143/79   Pulse 77   Temp (!) 97.5 F (36.4 C)   Resp 17   Ht 5' 10 (1.778 m)   Wt (!) 142.9 kg   SpO2 96%  BMI 45.20 kg/m   Physical Exam Vitals and nursing note reviewed.  Constitutional:      Appearance: He is well-developed.  HENT:     Head: Normocephalic and atraumatic.   Eyes:     General: No scleral icterus.    Pupils: Pupils are equal, round, and reactive to light.    Cardiovascular:     Pulses:          Posterior tibial pulses are 2+ on the right side and 2+ on the left side.     Heart sounds: Normal heart sounds.  Pulmonary:     Effort: Pulmonary effort is normal.     Breath sounds: Normal breath sounds. No wheezing.  Chest:     Chest wall: No tenderness.  Abdominal:     General: Bowel sounds are normal. There is no distension.     Palpations:  Abdomen is soft.     Tenderness: There is no abdominal tenderness.   Musculoskeletal:        General: No tenderness or deformity.     Cervical back: Normal range of motion.     Right lower leg: 2+ Pitting Edema present.     Left lower leg: 2+ Pitting Edema present.   Skin:    General: Skin is warm and dry.   Neurological:     Mental Status: He is alert and oriented to person, place, and time.     (all labs ordered are listed, but only abnormal results are displayed) Labs Reviewed  BASIC METABOLIC PANEL WITH GFR - Abnormal; Notable for the following components:      Result Value   Glucose, Bld 114 (*)    All other components within normal limits  CBG MONITORING, ED - Abnormal; Notable for the following components:   Glucose-Capillary 110 (*)    All other components within normal limits  I-STAT CHEM 8, ED - Abnormal; Notable for the following components:   Glucose, Bld 110 (*)    Calcium , Ion 1.14 (*)    All other components within normal limits  CBC  BRAIN NATRIURETIC PEPTIDE  HEPATIC FUNCTION PANEL  TROPONIN I (HIGH SENSITIVITY)  TROPONIN I (HIGH SENSITIVITY)    EKG: EKG Interpretation Date/Time:  Saturday July 23 2023 15:06:52 EDT Ventricular Rate:  75 PR Interval:  168 QRS Duration:  104 QT Interval:  372 QTC Calculation: 415 R Axis:   191  Text Interpretation: Normal sinus rhythm Right superior axis deviation Inferior infarct , age undetermined  no significant change since 2021 Confirmed by Freddi Hamilton [phone]) on 07/23/2023 3:52:08 PM  Radiology: CT Angio Chest PE W/Cm &/Or Wo Cm Result Date: 07/23/2023 CLINICAL DATA:  Pulmonary embolism suspected, high probability. EXAM: CT ANGIOGRAPHY CHEST WITH CONTRAST TECHNIQUE: Multidetector CT imaging of the chest was performed using the standard protocol during bolus administration of intravenous contrast. Multiplanar CT image reconstructions and MIPs were obtained to evaluate the vascular anatomy. RADIATION DOSE  REDUCTION: This exam was performed according to the departmental dose-optimization program which includes automated exposure control, adjustment of the mA and/or kV according to patient size and/or use of iterative reconstruction technique. CONTRAST:  75mL OMNIPAQUE  IOHEXOL  350 MG/ML SOLN COMPARISON:  None Available. FINDINGS: Cardiovascular: The heart is mildly enlarged in there is no pericardial effusion. A few scattered coronary artery calcifications are noted. There is atherosclerotic calcification of the aorta without evidence of aneurysm. The pulmonary trunk is normal in caliber. No evidence of pulmonary embolism. Evaluation of the segmental and subsegmental pulmonary arteries is  limited due to suboptimal opacification and respiratory motion. Mediastinum/Nodes: No mediastinal, hilar, or axillary lymphadenopathy. The thyroid gland, trachea, and esophagus are within normal limits. Lungs/Pleura: No consolidation, effusion, or pneumothorax. Upper Abdomen: Fatty infiltration of the liver is noted. No acute abnormality. Musculoskeletal: Degenerative changes are present in the thoracic spine. No acute osseous abnormality is seen. Review of the MIP images confirms the above findings. IMPRESSION: 1. No evidence of pulmonary embolism or other acute process. 2. Coronary artery calcifications. 3. Aortic atherosclerosis. 4. Hepatic steatosis. Electronically Signed   By: Leita Birmingham M.D.   On: 07/23/2023 17:10   DG Chest Portable 1 View Result Date: 07/23/2023 CLINICAL DATA:  Shortness of breath EXAM: PORTABLE CHEST 1 VIEW COMPARISON:  Chest radiograph dated 01/21/2020 FINDINGS: Low lung volumes with bronchovascular crowding. No focal consolidations. No pleural effusion or pneumothorax. Enlarged cardiomediastinal silhouette is likely projectional. No acute osseous abnormality. IMPRESSION: Low lung volumes with bronchovascular crowding. No focal consolidations. Electronically Signed   By: Limin  Xu M.D.   On: 07/23/2023  16:46     Medications Ordered in the ED  iohexol  (OMNIPAQUE ) 350 MG/ML injection 75 mL (75 mLs Intravenous Contrast Given 07/23/23 1633)                                   Medical Decision Making Amount and/or Complexity of Data Reviewed Labs: ordered.   This patient presents to the ED for concern of SOB, this involves a number of treatment options, and is a complaint that carries with it a HIGH risk of complications and morbidity.  The differential diagnosis includes CHF, PE, infection versus new onset COPD.    Co morbidities: Discussed in HPI   Brief History:  See HPI.   EMR reviewed including pt PMHx, past surgical history and past visits to ER.   See HPI for more details   Lab Tests:  I ordered and independently interpreted labs.  The pertinent results include:    I personally reviewed all laboratory work and imaging. Metabolic panel without any acute abnormality specifically kidney function within normal limits and no significant electrolyte abnormalities. CBC without leukocytosis or significant anemia.  Imaging Studies:  NAD. I personally reviewed all imaging studies and no acute abnormality found. I agree with radiology interpretation.  Cardiac Monitoring:  The patient was maintained on a cardiac monitor.  I personally viewed and interpreted the cardiac monitored which showed an underlying rhythm of: NSR EKG non-ischemic   Medicines ordered:  N/A Offered breathing treatment, steroids for likely COPD exacerbation, however patient declines at this time.  Reevaluation:  After the interventions noted above I re-evaluated patient and found that they have :improved  Social Determinants of Health:  The patient's social determinants of health were a factor in the care of this patient  Problem List / ED Course:  Patient here with a week of shortness of breath, dry cough, no fevers.  Feels like he is unable to take a deep breath.  Ongoing history of tobacco  use, however discontinued this a week ago due to feeling so unwell.  Blood work here with a normal CBC.  Did have some swelling to his ankles, appears to be somewhat fluid overloaded on exam but no hypoxia satting at 96% on room air. Chest x-ray without any signs of infection or consolidation.  BNP is within normal limits today, although some pitting edema noted, I do not suspect new onset heart  failure at this time.  He is currently on a HCTZ blood pressure regimen that should help with the swelling.  Troponin x 2 have remained flat, EKG is normal sinus rhythm, have a low concern for ACS at this time. CT angio negative for pulmonary embolism, no lesion noted.  I did discuss with patient and likely needing to reevaluate his pulmonary function test, as he does endorse tobacco use with an ongoing history of sleep apnea.  We also discussed that he feels that the nasal congestion makes it very hard for him to breathe, we discussed an antihistamine, along with Flonase.  I did also discussed with him likely this being a COPD exacerbation, he tells me that he does not want a steroid or breathing treatment at this time.  He does not sound wheezy to me, I do not feel that he warrants this at this time either.  He will follow-up with his PCP.  I do not feel that any further workup is needed at this time.  Dispostion:  After consideration of the diagnostic results and the patients response to treatment, I feel that the patent would benefit from follow-up with PCP for further evaluation of his ongoing shortness of breath.   Portions of this note were generated with Scientist, clinical (histocompatibility and immunogenetics). Dictation errors may occur despite best attempts at proofreading.   Final diagnoses:  Shortness of breath    ED Discharge Orders     None          Maureen Broad, PA-C 07/23/23 1850    Maureen Broad, PA-C 07/23/23 1851    Freddi Hamilton, MD 07/28/23 [phone]

## 2023-07-23 NOTE — ED Provider Triage Note (Signed)
 Emergency Medicine Provider Triage Evaluation Note  Jorge Bowman , a 58 y.o. male  was evaluated in triage.  Pt complains of epigastric pain as well as occasional shortness of breath.  Has occasional cough but this is nonproductive in nature.  Further has subjective shortness of breath, notices he also has increased congestion.  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  Previous medical history hyperlipidemia and hypertension.  Also wears CPAP for OSA.SABRA  Review of Systems  Positive: Epigastric pain, shortness of breath Negative: Nausea/vomiting  Physical Exam  BP (!) 143/79   Pulse 77   Temp (!) 97.5 F (36.4 C)   Resp 17   Ht 5' 10 (1.778 m)   Wt (!) 142.9 kg   SpO2 96%   BMI 45.20 kg/m  Gen:   Awake, no distress   Resp:  Normal effort, pulmonary auscultation is unremarkable. MSK:   Moves extremities without difficulty  Other:    Medical Decision Making  Medically screening exam initiated at 3:16 PM.  Appropriate orders placed.  Jorge Bowman was informed that the remainder of the evaluation will be completed by another provider, this initial triage assessment does not replace that evaluation, and the importance of remaining in the ED until their evaluation is complete.  Based on assessment of this patient, cannot rule out possibility of ACS, further differential of PE.  Given this we will begin workup for both.  Orders as placed.   Myriam Dorn [place]OCK[place], [place] 07/23/23 [phone]

## 2023-07-23 NOTE — Discharge Instructions (Addendum)
 We discussed the results of your laboratory studies on today's visit.  Your chest x-ray along with your CT angio were negative.  Please schedule an appointment with your neurologist or primary care physician in order to obtain further evaluation of your symptoms.

## 2023-12-19 ENCOUNTER — Encounter: Payer: Self-pay | Admitting: Family Medicine

## 2023-12-19 ENCOUNTER — Ambulatory Visit: Admitting: Family Medicine

## 2023-12-19 VITALS — BP 138/82 | HR 81 | Temp 98.1°F | Ht 70.0 in | Wt 315.0 lb

## 2023-12-19 DIAGNOSIS — Z125 Encounter for screening for malignant neoplasm of prostate: Secondary | ICD-10-CM

## 2023-12-19 DIAGNOSIS — G4733 Obstructive sleep apnea (adult) (pediatric): Secondary | ICD-10-CM | POA: Insufficient documentation

## 2023-12-19 DIAGNOSIS — Z1211 Encounter for screening for malignant neoplasm of colon: Secondary | ICD-10-CM

## 2023-12-19 DIAGNOSIS — I10 Essential (primary) hypertension: Secondary | ICD-10-CM

## 2023-12-19 DIAGNOSIS — F17200 Nicotine dependence, unspecified, uncomplicated: Secondary | ICD-10-CM

## 2023-12-19 DIAGNOSIS — E785 Hyperlipidemia, unspecified: Secondary | ICD-10-CM | POA: Diagnosis not present

## 2023-12-19 DIAGNOSIS — G4719 Other hypersomnia: Secondary | ICD-10-CM | POA: Insufficient documentation

## 2023-12-19 DIAGNOSIS — Z6841 Body Mass Index (BMI) 40.0 and over, adult: Secondary | ICD-10-CM

## 2023-12-19 DIAGNOSIS — E66813 Obesity, class 3: Secondary | ICD-10-CM

## 2023-12-19 DIAGNOSIS — G4734 Idiopathic sleep related nonobstructive alveolar hypoventilation: Secondary | ICD-10-CM | POA: Insufficient documentation

## 2023-12-19 MED ORDER — LOSARTAN POTASSIUM-HCTZ 100-12.5 MG PO TABS
1.0000 | ORAL_TABLET | Freq: Every day | ORAL | 3 refills | Status: AC
Start: 2023-12-19 — End: ?

## 2023-12-19 MED ORDER — ATORVASTATIN CALCIUM 80 MG PO TABS: 80.0000 mg | ORAL_TABLET | Freq: Every day | ORAL | 1 refills | Status: AC

## 2023-12-19 MED ORDER — TIRZEPATIDE-WEIGHT MANAGEMENT 2.5 MG/0.5ML ~~LOC~~ SOLN: 2.5000 mg | SUBCUTANEOUS | 2 refills | Status: DC

## 2023-12-19 NOTE — Progress Notes (Signed)
 Subjective:    Patient ID: Jorge Bowman, male    DOB: Sep 29, 1965, 58 y.o.   MRN: [id number]  Patient is here today for a follow-up of his blood pressure and his cholesterol.  His weight is 315 pounds and his BMI is 45.  His blood pressure at home is greater than 140/90 at least 50% of the time per his report.  Unfortunately he continues to smoke.  He states that he is doing 45 minutes of cardio every day.  He also tries to watch his diet and is eating a low carbohydrate diet but he is unable to lose weight.  He has been diagnosed with severe sleep apnea and is currently on CPAP.   Past Medical History:  Diagnosis Date   DDD (degenerative disc disease), cervical    DDD (degenerative disc disease), lumbar    Family history of premature coronary artery disease 10/09/2012   Hyperlipidemia    Hypertension    Obesity    No past surgical history on file. Current Outpatient Medications on File Prior to Visit  Medication Sig Dispense Refill   Multiple Vitamin (MULTIVITAMIN) tablet Take 1 tablet by mouth daily.     No current facility-administered medications on file prior to visit.   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   Social Drivers of Corporate Investment Banker Strain: Low Risk  (12/18/2023)   Overall Financial Resource Strain (CARDIA)    Difficulty of Paying  Living Expenses: Not hard at all  Food Insecurity: No Food Insecurity (12/18/2023)   Hunger Vital Sign    Worried About Running Out of Food in the Last Year: Never true    Ran Out of Food in the Last Year: Never true  Transportation Needs: No Transportation Needs (12/18/2023)   PRAPARE - Administrator, Civil Service (Medical): No    Lack of Transportation (Non-Medical): No  Physical Activity: Sufficiently Active (12/18/2023)   Exercise Vital Sign    Days of Exercise per Week: 6 days    Minutes of Exercise per Session: 50 min  Stress: No Stress Concern Present (12/18/2023)   [employer] of Occupational Health - Occupational Stress Questionnaire    Feeling of Stress: Only a little  Social Connections: Socially Integrated (12/18/2023)   Social Connection and Isolation Panel    Frequency of Communication with Friends and Family: More than three times a week    Frequency of Social Gatherings with Friends and Family: Three times a week    Attends Religious Services: More than 4 times per year    Active Member of Clubs or Organizations: Yes    Attends Banker Meetings: More than 4 times per year  Marital Status: Married  Catering Manager Violence: Not on file   Family History  Problem Relation Age of Onset   Heart disease Father 50       stent      Review of Systems  All other systems reviewed and are negative.      Objective:   Physical Exam Vitals reviewed.  Constitutional:      General: He is not in acute distress.    Appearance: He is well-developed. He is obese. He is not ill-appearing, toxic-appearing or diaphoretic.  HENT:     Head: Normocephalic and atraumatic.     Right Ear: Tympanic membrane and ear canal normal.     Left Ear: Tympanic membrane and ear canal normal.     Nose: Nose normal. No congestion.     Mouth/Throat:     Mouth: Mucous membranes are moist.     Pharynx: Oropharynx is clear. No posterior oropharyngeal erythema.   Eyes:     Extraocular Movements: Extraocular movements intact.     Conjunctiva/sclera: Conjunctivae normal.     Pupils: Pupils are equal, round, and reactive to light.  Neck:     Thyroid: No thyromegaly.     Vascular: No carotid bruit or JVD.  Cardiovascular:     Rate and Rhythm: Normal rate and regular rhythm.     Heart sounds: Normal heart sounds. No murmur heard.    No friction rub. No gallop.  Pulmonary:     Effort: Pulmonary effort is normal. No respiratory distress.     Breath sounds: Normal breath sounds. No stridor. No wheezing or rales.  Chest:     Chest wall: No tenderness.  Abdominal:     General: Bowel sounds are normal. There is no distension.     Palpations: Abdomen is soft. There is no mass.     Tenderness: There is no abdominal tenderness. There is no guarding or rebound.  Musculoskeletal:        General: No swelling or deformity.     Cervical back: Normal range of motion and neck supple. No rigidity.     Right lower leg: No edema.     Left lower leg: No edema.  Lymphadenopathy:     Cervical: No cervical adenopathy.  Skin:    General: Skin is warm.     Coloration: Skin is not jaundiced or pale.     Findings: No bruising, erythema, lesion or rash.  Neurological:     General: No focal deficit present.     Mental Status: He is alert and oriented to person, place, and time. Mental status is at baseline.     Cranial Nerves: No cranial nerve deficit.     Sensory: No sensory deficit.     Motor: No weakness.     Coordination: Coordination normal.     Gait: Gait normal.     Deep Tendon Reflexes: Reflexes normal.           Assessment & Plan:  Colon cancer screening - Plan: Ambulatory referral to Gastroenterology  Hyperlipidemia, unspecified hyperlipidemia type - Plan: atorvastatin  (LIPITOR) 80 MG tablet, CBC with Differential/Platelet, Comprehensive metabolic panel with GFR, Lipid panel, PSA  Primary hypertension - Plan: CBC with Differential/Platelet,  Comprehensive metabolic panel with GFR, Lipid panel, PSA  Prostate cancer screening - Plan: PSA  Smoker  Class 3 severe obesity with serious comorbidity and body mass index (BMI) of 45.0 to 49.9 in adult, unspecified obesity type (HCC) I will screen for colon cancer with a colonoscopy.  Screen for prostate cancer with a PSA.  Recommended smoking cessation.  Patient had a CT scan of the lungs in June that showed no evidence of lung cancer but did show coronary artery calcifications.  Switch losartan  to Hyzaar 100/12.5 p.o. daily to lower blood pressure further.  Try to get LDL cholesterol less than 70 using atorvastatin .  Recommended trying Zepbound 2.5 mg subcu weekly for weight loss given his BMI, his history of sleep apnea, his history of heart disease, and his history of hypertension.  Weight loss would improve his long-term mortality.  He declines a flu shot

## 2023-12-20 ENCOUNTER — Ambulatory Visit: Payer: Self-pay | Admitting: Family Medicine

## 2023-12-20 LAB — LIPID PANEL
Cholesterol: 142 mg/dL (ref <200)
HDL: 41 mg/dL (ref >=40)
LDL Cholesterol (Calc): 78 mg/dL
Non-HDL Cholesterol (Calc): 101 mg/dL (ref <130)
Total CHOL/HDL Ratio: 3.5 (calc) (ref <5.0)
Triglycerides: 136 mg/dL (ref <150)

## 2023-12-20 LAB — COMPREHENSIVE METABOLIC PANEL WITH GFR
AG Ratio: 1.7 (calc) (ref 1.0–2.5)
ALT: 16 U/L (ref 9–46)
AST: 16 U/L (ref 10–35)
Albumin: 4 g/dL (ref 3.6–5.1)
Alkaline phosphatase (APISO): 96 U/L (ref 35–144)
BUN: 14 mg/dL (ref 7–25)
CO2: 28 mmol/L (ref 20–32)
Calcium: 9 mg/dL (ref 8.6–10.3)
Chloride: 105 mmol/L (ref 98–110)
Creat: 0.96 mg/dL (ref 0.70–1.30)
Globulin: 2.3 g/dL (ref 1.9–3.7)
Glucose, Bld: 104 mg/dL — ABNORMAL HIGH (ref 65–99)
Potassium: 5.1 mmol/L (ref 3.5–5.3)
Sodium: 141 mmol/L (ref 135–146)
Total Bilirubin: 0.2 mg/dL (ref 0.2–1.2)
Total Protein: 6.3 g/dL (ref 6.1–8.1)
eGFR: 91 mL/min/1.73m2 (ref >=60)

## 2023-12-20 LAB — CBC WITH DIFFERENTIAL/PLATELET
Absolute Lymphocytes: 2183 {cells}/uL (ref 850–3900)
Absolute Monocytes: 585 {cells}/uL (ref 200–950)
Basophils Absolute: 98 {cells}/uL (ref 0–200)
Basophils Relative: 1.3 %
Eosinophils Absolute: 353 {cells}/uL (ref 15–500)
Eosinophils Relative: 4.7 %
HCT: 38.5 % (ref 38.5–50.0)
Hemoglobin: 12.6 g/dL — ABNORMAL LOW (ref 13.2–17.1)
MCH: 29.6 pg (ref 27.0–33.0)
MCHC: 32.7 g/dL (ref 32.0–36.0)
MCV: 90.6 fL (ref 80.0–100.0)
MPV: 10.6 fL (ref 7.5–12.5)
Monocytes Relative: 7.8 %
Neutro Abs: 4283 {cells}/uL (ref 1500–7800)
Neutrophils Relative %: 57.1 %
Platelets: 353 Thousand/uL (ref 140–400)
RBC: 4.25 Million/uL (ref 4.20–5.80)
RDW: 12.2 % (ref 11.0–15.0)
Total Lymphocyte: 29.1 %
WBC: 7.5 Thousand/uL (ref 3.8–10.8)

## 2023-12-20 LAB — PSA: PSA: 0.37 ng/mL (ref <=4.00)

## 2023-12-21 ENCOUNTER — Telehealth: Payer: Self-pay | Admitting: Family Medicine

## 2023-12-21 NOTE — Telephone Encounter (Signed)
 Copied from CRM [id number]. Topic: Clinical - Prescription Issue >> Dec 21, 2023  3:52 PM Antwanette L wrote: Reason for CRM: Landon from [facility] called to inform the provider that they are unable to obtain tirzepatide (Zepbound) 2.5 mg/0.5 mL injection in vial form. The pharmacy currently only has access to prefilled pens. [place] can be contacted at [phone]

## 2023-12-22 ENCOUNTER — Other Ambulatory Visit: Payer: Self-pay | Admitting: Family Medicine

## 2023-12-28 ENCOUNTER — Telehealth: Payer: Self-pay

## 2023-12-28 ENCOUNTER — Other Ambulatory Visit: Payer: Self-pay

## 2023-12-28 ENCOUNTER — Other Ambulatory Visit: Payer: Self-pay | Admitting: Family Medicine

## 2023-12-28 DIAGNOSIS — E66813 Obesity, class 3: Secondary | ICD-10-CM

## 2023-12-28 DIAGNOSIS — E785 Hyperlipidemia, unspecified: Secondary | ICD-10-CM

## 2023-12-28 DIAGNOSIS — I10 Essential (primary) hypertension: Secondary | ICD-10-CM

## 2023-12-28 MED ORDER — TIRZEPATIDE-WEIGHT MANAGEMENT 2.5 MG/0.5ML ~~LOC~~ SOLN: 2.5000 mg | SUBCUTANEOUS | 2 refills | Status: AC

## 2023-12-28 NOTE — Telephone Encounter (Signed)
 Copied from CRM [id number]. Topic: Clinical - Medication Refill >> Dec 28, 2023  2:15 PM Cynthia K wrote: Medication:  tirzepatide  (ZEPBOUND ) 2.5 MG/0.5ML Pen   Has the patient contacted their pharmacy? Yes (Agent: If no, request that the patient contact the pharmacy for the refill. If patient does not wish to contact the pharmacy document the reason why and proceed with request.) (Agent: If yes, when and what did the pharmacy advise?) Pharmacy needs a new order for this medication  This is the patient's preferred pharmacy:  [facility] - [place], [place] - [address] [place] [address] Phone: [phone] Fax: [phone]  Is this the correct pharmacy for this prescription? Yes If no, delete pharmacy and type the correct one.   Has the prescription been filled recently? No  Is the patient out of the medication? No  Has the patient been seen for an appointment in the last year OR does the patient have an upcoming appointment? Yes  Can we respond through MyChart? No  Agent: Please be advised that Rx refills may take up to 3 business days. We ask that you follow-up with your pharmacy.

## 2023-12-30 ENCOUNTER — Telehealth: Payer: Self-pay

## 2023-12-30 ENCOUNTER — Other Ambulatory Visit (HOSPITAL_COMMUNITY): Payer: Self-pay

## 2023-12-30 NOTE — Telephone Encounter (Signed)
 Pharmacy Patient Advocate Encounter  Insurance verification completed.   The patient is insured through [employer] test claim for tirzepatide  (ZEPBOUND ) 2.5 MG/0.5ML injection . The Rx is not covered under the pts plan.It is excluded.         This test claim was processed through [facility]- copay amounts may vary at other pharmacies due to pharmacy/plan contracts, or as the patient moves through the different stages of their insurance plan.

## 2024-01-03 NOTE — Telephone Encounter (Signed)
 Duplicate request.  Requested Prescriptions  Pending Prescriptions Disp Refills   tirzepatide  (ZEPBOUND ) 2.5 MG/0.5ML injection vial 2 mL 2    Sig: Inject 2.5 mg into the skin once a week.     Off-Protocol Failed - 01/03/2024 10:04 AM      Failed - Medication not assigned to a protocol, review manually.      Passed - Valid encounter within last 12 months    Recent Outpatient Visits           2 weeks ago Colon cancer screening   [place] [facility] Pickard, Butler DASEN, MD   1 year ago Colon cancer screening   [place] [facility] Duanne Butler DASEN, MD   2 years ago Viral URI with cough   [place] [facility] Kayla Jeoffrey RAMAN, FNP
